# Patient Record
Sex: Female | Born: 1970
Health system: Southern US, Community
[De-identification: ages and names within clinical notes are randomized; demographics above are authoritative.]

## PROBLEM LIST (undated history)

## (undated) ENCOUNTER — Ambulatory Visit: Admission: EM | Disposition: A | Discharge status: Home / Self Care | Payer: Self-pay

## (undated) DIAGNOSIS — J45909 Unspecified asthma, uncomplicated: Secondary | ICD-10-CM

## (undated) HISTORY — PX: HERNIA REPAIR: SHX51

## (undated) HISTORY — PX: TUBAL LIGATION: SHX77

## (undated) HISTORY — PX: OTHER SURGICAL HISTORY: SHX169

## (undated) HISTORY — PX: ABDOMINAL HYSTERECTOMY: SHX81

---

## 2005-12-09 ENCOUNTER — Emergency Department: Payer: Self-pay

## 2006-04-25 ENCOUNTER — Emergency Department: Payer: Self-pay

## 2006-06-24 ENCOUNTER — Ambulatory Visit: Payer: Self-pay

## 2006-08-04 ENCOUNTER — Encounter: Payer: Self-pay | Admitting: Maternal & Fetal Medicine

## 2006-10-18 ENCOUNTER — Observation Stay: Payer: Self-pay

## 2006-11-03 ENCOUNTER — Encounter: Payer: Self-pay | Admitting: Maternal & Fetal Medicine

## 2006-11-13 ENCOUNTER — Observation Stay: Payer: Self-pay

## 2006-12-01 ENCOUNTER — Encounter: Payer: Self-pay | Admitting: Maternal & Fetal Medicine

## 2006-12-05 ENCOUNTER — Observation Stay: Payer: Self-pay

## 2006-12-06 ENCOUNTER — Inpatient Hospital Stay: Payer: Self-pay

## 2007-07-25 ENCOUNTER — Emergency Department: Payer: Self-pay | Admitting: Internal Medicine

## 2008-05-21 ENCOUNTER — Emergency Department: Payer: Self-pay | Admitting: Emergency Medicine

## 2008-09-26 ENCOUNTER — Ambulatory Visit: Payer: Self-pay | Admitting: General Surgery

## 2008-09-30 ENCOUNTER — Ambulatory Visit: Payer: Self-pay | Admitting: General Surgery

## 2008-11-07 ENCOUNTER — Emergency Department: Payer: Self-pay | Admitting: Emergency Medicine

## 2008-11-09 ENCOUNTER — Ambulatory Visit: Payer: Self-pay | Admitting: Emergency Medicine

## 2011-02-03 ENCOUNTER — Ambulatory Visit: Payer: Self-pay | Admitting: Family Medicine

## 2011-05-18 ENCOUNTER — Emergency Department: Payer: Self-pay

## 2011-05-24 ENCOUNTER — Emergency Department: Payer: Self-pay | Admitting: Emergency Medicine

## 2011-08-30 ENCOUNTER — Emergency Department: Payer: Self-pay | Admitting: *Deleted

## 2011-08-30 LAB — BASIC METABOLIC PANEL WITH GFR
Anion Gap: 10
BUN: 14 mg/dL
Calcium, Total: 8.4 mg/dL — ABNORMAL LOW
Chloride: 110 mmol/L — ABNORMAL HIGH
Co2: 22 mmol/L
Creatinine: 1.04 mg/dL
EGFR (African American): >60
EGFR (Non-African Amer.): >60
Glucose: 101 mg/dL — ABNORMAL HIGH
Osmolality: 284
Potassium: 3.6 mmol/L
Sodium: 142 mmol/L

## 2011-08-30 LAB — CBC
HCT: 39.1 % (ref 35.0–47.0)
MCH: 30.9 pg (ref 26.0–34.0)
MCHC: 33.8 g/dL (ref 32.0–36.0)
MCV: 91 fL (ref 80–100)
RBC: 4.28 10*6/uL (ref 3.80–5.20)
RDW: 13.8 % (ref 11.5–14.5)
WBC: 7.5 10*3/uL (ref 3.6–11.0)

## 2011-08-30 LAB — TROPONIN I: Troponin-I: <0.02 ng/mL

## 2011-09-10 ENCOUNTER — Emergency Department: Payer: Self-pay | Admitting: *Deleted

## 2012-05-01 ENCOUNTER — Emergency Department: Payer: Self-pay | Admitting: Emergency Medicine

## 2016-04-20 ENCOUNTER — Emergency Department
Admission: EM | Admit: 2016-04-20 | Discharge: 2016-04-20 | Disposition: A | Discharge status: Home / Self Care | Payer: Medicaid Other | Attending: Emergency Medicine | Admitting: Emergency Medicine

## 2016-04-20 ENCOUNTER — Encounter: Payer: Self-pay | Admitting: Emergency Medicine

## 2016-04-20 ENCOUNTER — Emergency Department: Payer: Medicaid Other

## 2016-04-20 DIAGNOSIS — Y939 Activity, unspecified: Secondary | ICD-10-CM | POA: Insufficient documentation

## 2016-04-20 DIAGNOSIS — Y929 Unspecified place or not applicable: Secondary | ICD-10-CM | POA: Diagnosis not present

## 2016-04-20 DIAGNOSIS — W010XXA Fall on same level from slipping, tripping and stumbling without subsequent striking against object, initial encounter: Secondary | ICD-10-CM | POA: Insufficient documentation

## 2016-04-20 DIAGNOSIS — S6391XA Sprain of unspecified part of right wrist and hand, initial encounter: Secondary | ICD-10-CM | POA: Diagnosis not present

## 2016-04-20 DIAGNOSIS — F17210 Nicotine dependence, cigarettes, uncomplicated: Secondary | ICD-10-CM | POA: Diagnosis not present

## 2016-04-20 DIAGNOSIS — Y999 Unspecified external cause status: Secondary | ICD-10-CM | POA: Insufficient documentation

## 2016-04-20 DIAGNOSIS — S60221A Contusion of right hand, initial encounter: Secondary | ICD-10-CM

## 2016-04-20 DIAGNOSIS — S6991XA Unspecified injury of right wrist, hand and finger(s), initial encounter: Secondary | ICD-10-CM | POA: Diagnosis present

## 2016-04-20 MED ORDER — TRAMADOL HCL 50 MG PO TABS: 50.0000 mg | ORAL_TABLET | Freq: Four times a day (QID) | ORAL | 0 refills | Status: DC | PRN

## 2016-04-20 MED ORDER — IBUPROFEN 600 MG PO TABS
600.0000 mg | ORAL_TABLET | Freq: Three times a day (TID) | ORAL | 0 refills | Status: DC | PRN
Start: 2016-04-20 — End: 2017-05-14

## 2016-04-20 NOTE — Discharge Instructions (Signed)
Wear splint for 3-5 days as needed. °

## 2016-04-20 NOTE — ED Provider Notes (Signed)
[facility] Emergency Department Provider Note   ____________________________________________   First MD Initiated Contact with Patient 04/20/16 1723     (approximate)  I have reviewed the triage vital signs and the nursing notes.   HISTORY  Chief Complaint Hand Pain    HPI Alexis Gilbert is a 45 y.o. female patient complaining of right hand pain secondary to a slip and fall last night. Patient stated pain is increased on the day. Patient stated no relief taking anti-inflammatory medications. Patient rates the pain as a 7/10. She describes pain as "achy". Patient denies loss of sensation or loss of function of the affected extremity. No other palliative measures for her complaint.Patient is right-hand dominant.   History reviewed. No pertinent past medical history.  There are no active problems to display for this patient.   Past Surgical History:  Procedure Laterality Date  . hemmorhoidectomy    . HERNIA REPAIR    . TUBAL LIGATION      Prior to Admission medications   Medication Sig Start Date End Date Taking? Authorizing Provider  ibuprofen (ADVIL,MOTRIN) 600 MG tablet Take 1 tablet (600 mg total) by mouth every 8 (eight) hours as needed. 04/20/16   Joni Reiningonald K Smith, PA-C  traMADol (ULTRAM) 50 MG tablet Take 1 tablet (50 mg total) by mouth every 6 (six) hours as needed for moderate pain. 04/20/16   Joni Reiningonald K Smith, PA-C    Allergies Vicodin [hydrocodone-acetaminophen]  No family history on file.  Social History Social History  Substance Use Topics  . Smoking status: Current Every Day Smoker    Packs/day: 0.30    Types: Cigarettes  . Smokeless tobacco: Not on file  . Alcohol use Not on file    Review of Systems Constitutional: No fever/chills Eyes: No visual changes. ENT: No sore throat. Cardiovascular: Denies chest pain. Respiratory: Denies shortness of breath. Gastrointestinal: No abdominal pain.  No nausea, no vomiting.  No  diarrhea.  No constipation. Genitourinary: Negative for dysuria. Musculoskeletal: Positive for right hand pain  Skin: Negative for rash. Neurological: Negative for headaches, focal weakness or numbness. Allergic/Immunilogical: Vicodin ____________________________________________   PHYSICAL EXAM:  VITAL SIGNS: ED Triage Vitals  Enc Vitals Group     BP 04/20/16 1518 (!) 142/90     Pulse Rate 04/20/16 1518 76     Resp 04/20/16 1518 18     Temp 04/20/16 1518 98.3 F (36.8 C)     Temp Source 04/20/16 1518 Oral     SpO2 04/20/16 1518 98 %     Weight 04/20/16 1519 198 lb (89.8 kg)     Height 04/20/16 1519 5\' 7"  (1.702 m)     Head Circumference --      Peak Flow --      Pain Score 04/20/16 1519 7     Pain Loc --      Pain Edu? --      Excl. in GC? --     Constitutional: Alert and oriented. Well appearing and in no acute distress. Eyes: Conjunctivae are normal. PERRL. EOMI. Head: Atraumatic. Nose: No congestion/rhinnorhea. Mouth/Throat: Mucous membranes are moist.  Oropharynx non-erythematous. Neck: No stridor.  No cervical spine tenderness to palpation. Hematological/Lymphatic/Immunilogical: No cervical lymphadenopathy. Cardiovascular: Normal rate, regular rhythm. Grossly normal heart sounds.  Good peripheral circulation. Respiratory: Normal respiratory effort.  No retractions. Lungs CTAB. Gastrointestinal: Soft and nontender. No distention. No abdominal bruits. No CVA tenderness. Musculoskeletal: No ecchymosis deformity to the right hand. There is some mild edema  at the fifth metacarpal. Patient has full equal range of motion of the hand. Patient has some moderate guarding with flexion extension of the wrist. Neurologic:  Normal speech and language. No gross focal neurologic deficits are appreciated. No gait instability. Skin:  Skin is warm, dry and intact. No rash noted. Psychiatric: Mood and affect are normal. Speech and behavior are  normal.  ____________________________________________   LABS (all labs ordered are listed, but only abnormal results are displayed)  Labs Reviewed - No data to display ____________________________________________  EKG   ____________________________________________  RADIOLOGY  No acute findings x-ray of the right hand. ____________________________________________   PROCEDURES  Procedure(s) performed: None  Procedures  Critical Care performed: No  ____________________________________________   INITIAL IMPRESSION / ASSESSMENT AND PLAN / ED COURSE  Pertinent labs & imaging results that were available during my care of the patient were reviewed by me and considered in my medical decision making (see chart for details).  Sprain and contusion to right hand secondary to a fall. Patient given discharge care instructions. Patient given prescription for tramadol and ibuprofen. Rest and follow-up family doctor condition persists.  Clinical Course    Patient placed in a volar hand splint.   ____________________________________________   FINAL CLINICAL IMPRESSION(S) / ED DIAGNOSES  Final diagnoses:  Contusion of right hand, initial encounter      NEW MEDICATIONS STARTED DURING THIS VISIT:  New Prescriptions   IBUPROFEN (ADVIL,MOTRIN) 600 MG TABLET    Take 1 tablet (600 mg total) by mouth every 8 (eight) hours as needed.   TRAMADOL (ULTRAM) 50 MG TABLET    Take 1 tablet (50 mg total) by mouth every 6 (six) hours as needed for moderate pain.     Note:  This document was prepared using Dragon voice recognition software and may include unintentional dictation errors.    Joni Reiningonald K Smith, PA-C 04/20/16 1730    Jene Everyobert Kinner, MD 04/20/16 [id number]

## 2016-04-20 NOTE — ED Triage Notes (Signed)
Fell last night, R hand pain.

## 2016-04-20 NOTE — ED Notes (Signed)
Pt given discharge instructions. Splint being applied by medic and pt will be d/c'd afterwards.

## 2016-05-15 MED ORDER — METOPROLOL TARTRATE 25 MG PO TABS
ORAL_TABLET | ORAL | Status: AC
  Filled 2016-05-15: qty 1

## 2016-08-09 ENCOUNTER — Emergency Department
Admission: EM | Admit: 2016-08-09 | Discharge: 2016-08-09 | Disposition: A | Discharge status: Home / Self Care | Payer: Medicaid Other | Attending: Emergency Medicine | Admitting: Emergency Medicine

## 2016-08-09 ENCOUNTER — Emergency Department: Payer: Medicaid Other

## 2016-08-09 DIAGNOSIS — M779 Enthesopathy, unspecified: Secondary | ICD-10-CM | POA: Diagnosis not present

## 2016-08-09 DIAGNOSIS — Z79899 Other long term (current) drug therapy: Secondary | ICD-10-CM | POA: Diagnosis not present

## 2016-08-09 DIAGNOSIS — M778 Other enthesopathies, not elsewhere classified: Secondary | ICD-10-CM

## 2016-08-09 DIAGNOSIS — F17210 Nicotine dependence, cigarettes, uncomplicated: Secondary | ICD-10-CM | POA: Insufficient documentation

## 2016-08-09 DIAGNOSIS — M79641 Pain in right hand: Secondary | ICD-10-CM | POA: Diagnosis present

## 2016-08-09 MED ORDER — NAPROXEN 500 MG PO TABS: 500.0000 mg | ORAL_TABLET | Freq: Two times a day (BID) | ORAL | Status: DC

## 2016-08-09 MED ORDER — SULFAMETHOXAZOLE-TRIMETHOPRIM 800-160 MG PO TABS: 1.0000 | ORAL_TABLET | Freq: Two times a day (BID) | ORAL | 0 refills | Status: DC

## 2016-08-09 NOTE — ED Triage Notes (Addendum)
Pt reports to ED w/ c/o R thumb swelling, denies injury to area. Sensation, circulation and motor function intact.  Pts R thumb slightly swollen, pt reports intermittent throbbing pain

## 2016-08-09 NOTE — Discharge Instructions (Signed)
Splint for 3-5 days, take medication as directed. Follow up with orthopedics if no improvement in 3 days.

## 2016-08-09 NOTE — ED Provider Notes (Signed)
[facility] Emergency Department Provider Note   ____________________________________________   First MD Initiated Contact with Patient 08/09/16 1251     (approximate)  I have reviewed the triage vital signs and the nursing notes.   HISTORY  Chief Complaint Hand Pain    HPI Alexis Gilbert is a 46 y.o. female patient complain pain, edema, erythema to the first digit right hand. Patient state onset 3 days. Patient state pain is increasing and she now rates as 8/10. Patient denies any provocative incident for her complaint. Patient denies loss of sensation but states decreased range of motion with adduction movements of the thumb. Patient is right-hand dominant.   History reviewed. No pertinent past medical history.  There are no active problems to display for this patient.   Past Surgical History:  Procedure Laterality Date  . hemmorhoidectomy    . HERNIA REPAIR    . TUBAL LIGATION      Prior to Admission medications   Medication Sig Start Date End Date Taking? Authorizing Provider  traMADol (ULTRAM) 50 MG tablet Take 1 tablet (50 mg total) by mouth every 6 (six) hours as needed for moderate pain. 04/20/16  Yes Joni Reining, PA-C  ibuprofen (ADVIL,MOTRIN) 600 MG tablet Take 1 tablet (600 mg total) by mouth every 8 (eight) hours as needed. 04/20/16   Joni Reining, PA-C  naproxen (NAPROSYN) 500 MG tablet Take 1 tablet (500 mg total) by mouth 2 (two) times daily with a meal. 08/09/16   Joni Reining, PA-C  sulfamethoxazole-trimethoprim (BACTRIM DS,SEPTRA DS) 800-160 MG tablet Take 1 tablet by mouth 2 (two) times daily. 08/09/16   Joni Reining, PA-C    Allergies   No family history on file.  Social History Social History  Substance Use Topics  . Smoking status: Current Every Day Smoker    Packs/day: 0.30    Types: Cigarettes  . Smokeless tobacco: Never Used  . Alcohol use Not on file    Review of Systems Constitutional: No  fever/chills Eyes: No visual changes. ENT: No sore throat. Cardiovascular: Denies chest pain. Respiratory: Denies shortness of breath. Gastrointestinal: No abdominal pain.  No nausea, no vomiting.  No diarrhea.  No constipation. Genitourinary: Negative for dysuria. Musculoskeletal: Right thumb pain  Skin: Negative for rash. Redness and swelling to the first digit right hand Neurological: Negative for headaches, focal weakness or numbness.    ____________________________________________   PHYSICAL EXAM:  VITAL SIGNS: ED Triage Vitals [08/09/16 1251]  Enc Vitals Group     BP 128/84     Pulse Rate 83     Resp 18     Temp 98.1 F (36.7 C)     Temp Source Oral     SpO2 98 %     Weight 185 lb (83.9 kg)     Height 5\' 6"  (1.676 m)     Head Circumference      Peak Flow      Pain Score 8     Pain Loc      Pain Edu?      Excl. in GC?     Constitutional: Alert and oriented. Well appearing and in no acute distress. Eyes: Conjunctivae are normal. PERRL. EOMI. Head: Atraumatic. Nose: No congestion/rhinnorhea. Mouth/Throat: Mucous membranes are moist.  Oropharynx non-erythematous. Neck: No stridor.  No cervical spine tenderness to palpation. Hematological/Lymphatic/Immunilogical: No cervical lymphadenopathy. Cardiovascular: Normal rate, regular rhythm. Grossly normal heart sounds.  Good peripheral circulation. Respiratory: Normal respiratory effort.  No  retractions. Lungs CTAB. Gastrointestinal: Soft and nontender. No distention. No abdominal bruits. No CVA tenderness. Musculoskeletal: No obvious deformity to the first digit right hand. Patient has some mild edema and erythema radiating from the nailbed to the head of the first metacarpal. Neurologic:  Normal speech and language. No gross focal neurologic deficits are appreciated. No gait instability. Skin:  Skin is warm, dry and intact. No rash noted. Psychiatric: Mood and affect are normal. Speech and behavior are  normal.  ____________________________________________   LABS (all labs ordered are listed, but only abnormal results are displayed)  Labs Reviewed - No data to display ____________________________________________  EKG   ____________________________________________  RADIOLOGY  No acute findings on x-ray of the right thumb.____________________________________________   PROCEDURES  Procedure(s) performed: None  Procedures  Critical Care performed: No  ____________________________________________   INITIAL IMPRESSION / ASSESSMENT AND PLAN / ED COURSE  Pertinent labs & imaging results that were available during my care of the patient were reviewed by me and considered in my medical decision making (see chart for details).  Tendinitis right thumb. Patient is discharged care instructions. Discussed x-ray findings with patient. Patient given a prescription for naproxen and Bactrim. Patient advised to wear finger splint for 3 days. Patient advised to follow orthopedics is no improvement in 3 days.      ____________________________________________   FINAL CLINICAL IMPRESSION(S) / ED DIAGNOSES  Final diagnoses:  Tendinitis of thumb      NEW MEDICATIONS STARTED DURING THIS VISIT:  Discharge Medication List as of 08/09/2016  1:39 PM    START taking these medications   Details  naproxen (NAPROSYN) 500 MG tablet Take 1 tablet (500 mg total) by mouth 2 (two) times daily with a meal., Starting Fri 08/09/2016, Print    sulfamethoxazole-trimethoprim (BACTRIM DS,SEPTRA DS) 800-160 MG tablet Take 1 tablet by mouth 2 (two) times daily., Starting Fri 08/09/2016, Print         Note:  This document was prepared using Dragon voice recognition software and may include unintentional dictation errors.    Joni Reiningonald K Legrande Hao, PA-C 08/09/16 1345    Governor Rooksebecca Lord, MD 08/09/16 [phone]

## 2017-03-26 ENCOUNTER — Ambulatory Visit: Payer: Self-pay | Admitting: Physician Assistant

## 2017-03-31 ENCOUNTER — Ambulatory Visit: Payer: Self-pay | Admitting: Physician Assistant

## 2017-03-31 ENCOUNTER — Encounter: Payer: Self-pay | Admitting: Physician Assistant

## 2017-03-31 VITALS — BP 128/80 | HR 95 | Temp 97.8°F

## 2017-03-31 DIAGNOSIS — H6502 Acute serous otitis media, left ear: Secondary | ICD-10-CM

## 2017-03-31 MED ORDER — AMOXICILLIN 875 MG PO TABS: 875.0000 mg | ORAL_TABLET | Freq: Two times a day (BID) | ORAL | 0 refills | Status: DC

## 2017-03-31 MED ORDER — PREDNISONE 10 MG PO TABS: 30.0000 mg | ORAL_TABLET | Freq: Every day | ORAL | 0 refills | Status: DC

## 2017-03-31 NOTE — Progress Notes (Signed)
S: c/o left ear pain, thought it was swimmers ear and used drops, can't hear well out of the ear, no fever/chills, some sinus congestion, no cough, cp/sob  O: vitals wnl, nad, left tm dull + fluid line, r tm is dull, nasal mucosa inflamed, throat wnl, neck supple no lymph, lungs c t a, cv rrr  A: acute otitis media  P: amoxil, pred 30mg  qd x 3d, otc sudafed, if hearing is not better in 5 days will refer to ENT

## 2017-05-14 ENCOUNTER — Encounter: Payer: Self-pay | Admitting: Emergency Medicine

## 2017-05-14 ENCOUNTER — Emergency Department
Admission: EM | Admit: 2017-05-14 | Discharge: 2017-05-14 | Disposition: A | Discharge status: Home / Self Care | Payer: Self-pay | Attending: Emergency Medicine | Admitting: Emergency Medicine

## 2017-05-14 DIAGNOSIS — Z79899 Other long term (current) drug therapy: Secondary | ICD-10-CM | POA: Insufficient documentation

## 2017-05-14 DIAGNOSIS — R69 Illness, unspecified: Secondary | ICD-10-CM | POA: Insufficient documentation

## 2017-05-14 DIAGNOSIS — F17210 Nicotine dependence, cigarettes, uncomplicated: Secondary | ICD-10-CM | POA: Insufficient documentation

## 2017-05-14 DIAGNOSIS — J111 Influenza due to unidentified influenza virus with other respiratory manifestations: Secondary | ICD-10-CM

## 2017-05-14 LAB — INFLUENZA PANEL BY PCR (TYPE A & B)
INFLAPCR: NEGATIVE
INFLBPCR: NEGATIVE

## 2017-05-14 MED ORDER — IBUPROFEN 600 MG PO TABS
600.0000 mg | ORAL_TABLET | Freq: Once | ORAL | Status: AC
  Administered 2017-05-14: 600 mg via ORAL
  Filled 2017-05-14: qty 1

## 2017-05-14 NOTE — ED Triage Notes (Signed)
Patient presents to ED via POV from work with c/o flu like symptoms x 2 days. Patient texting during triage.

## 2017-05-14 NOTE — ED Provider Notes (Signed)
[facility] Emergency Department Provider Note  ____________________________________________   First MD Initiated Contact with Patient 05/14/17 1220     (approximate)  I have reviewed the triage vital signs and the nursing notes.   HISTORY  Chief Complaint Generalized Body Aches    HPI Alexis Gilbert is a 46 y.o. female complains of body aches and questionable fever, states her ears hurt, states she just hurts all over, some cough, no sore throat, no vomiting or diarrhea, she did get her flu vaccine  History reviewed. No pertinent past medical history.  There are no active problems to display for this patient.   Past Surgical History:  Procedure Laterality Date  . hemmorhoidectomy    . HERNIA REPAIR    . TUBAL LIGATION      Prior to Admission medications   Medication Sig Start Date End Date Taking? Authorizing Provider  levonorgestrel (MIRENA) 20 MCG/24HR IUD 1 each once by Intrauterine route.    [provider]    Allergies Codeine and Vicodin [hydrocodone-acetaminophen]  No family history on file.  Social History Social History   Tobacco Use  . Smoking status: Current Every Day Smoker    Packs/day: 0.30    Types: Cigarettes  . Smokeless tobacco: Never Used  Substance Use Topics  . Alcohol use: Not on file  . Drug use: Not on file    Review of Systems  Constitutional: Positive fever/chills, and body aches Eyes: No visual changes. ENT: No sore throat. Respiratory: Positive cough Genitourinary: Negative for dysuria. Musculoskeletal: Negative for back pain. Skin: Negative for rash.    ____________________________________________   PHYSICAL EXAM:  VITAL SIGNS: ED Triage Vitals  Enc Vitals Group     BP 05/14/17 1154 118/84     Pulse Rate 05/14/17 1154 71     Resp 05/14/17 1154 18     Temp 05/14/17 1154 98.1 F (36.7 C)     Temp Source 05/14/17 1154 Oral     SpO2 05/14/17 1154 97 %     Weight 05/14/17  1154 189 lb (85.7 kg)     Height 05/14/17 1154 5\' 7"  (1.702 m)     Head Circumference --      Peak Flow --      Pain Score 05/14/17 1242 5     Pain Loc --      Pain Edu? --      Excl. in GC? --     Constitutional: Alert and oriented. Well appearing and in no acute distress. Eyes: Conjunctivae are normal.  Head: Atraumatic. Nose: No congestion/rhinnorhea. Mouth/Throat: Mucous membranes are moist.  Throat is normal Cardiovascular: Normal rate, regular rhythm.  Heart sounds are normal Respiratory: Normal respiratory effort.  No retractions, lungs are clear to auscultation GU: deferred Musculoskeletal: FROM all extremities, warm and well perfused Neurologic:  Normal speech and language.  Skin:  Skin is warm, dry and intact. No rash noted. Psychiatric: Mood and affect are normal. Speech and behavior are normal.  ____________________________________________   LABS (all labs ordered are listed, but only abnormal results are displayed)  Labs Reviewed  INFLUENZA PANEL BY PCR (TYPE A & B)   ____________________________________________   ____________________________________________  RADIOLOGY    ____________________________________________   PROCEDURES  Procedure(s) performed: No      ____________________________________________   INITIAL IMPRESSION / ASSESSMENT AND PLAN / ED COURSE  Pertinent labs & imaging results that were available during my care of the patient were reviewed by me and considered in my  medical decision making (see chart for details).  Patient is a [age] year old female complains of fever, chills and body aches, also states that her right ear hurts patient is afraid that she has the flu, she is involved in patient care, flu swab was obtained    ----------------------------------------- 2:16 PM on 05/14/2017 -----------------------------------------  Flu swab is negative, explained to patient that it is viral, she is to follow-up with her regular  doctor if she is not better in 3-5 days, she may return to work tomorrow if she is not had a fever, hospital policy as she has to be fever free for 24 hours, she is afebrile here in the ER, patient states she understands the diagnosis and treatment plan and will comply, she was discharged in stable condition   ____________________________________________   FINAL CLINICAL IMPRESSION(S) / ED DIAGNOSES  Final diagnoses:  Influenza-like illness      NEW MEDICATIONS STARTED DURING THIS VISIT:  This SmartLink is deprecated. Use AVSMEDLIST instead to display the medication list for a patient.   Note:  This document was prepared using Dragon voice recognition software and may include unintentional dictation errors.    Faythe GheeFisher, Susan W, PA-C 05/14/17 1417    Emily FilbertWilliams, Jonathan E, MD 05/14/17 [phone]

## 2017-05-14 NOTE — Discharge Instructions (Signed)
Follow-up with your regular doctor if you are not better in 3-5 days, take Tylenol and ibuprofen as needed for fever and body aches, drink plenty of fluids, if you are worsening can return to the emergency department

## 2017-07-15 ENCOUNTER — Ambulatory Visit: Payer: Self-pay | Admitting: Nurse Practitioner

## 2017-07-15 VITALS — BP 125/79 | HR 85 | Temp 97.7°F | Wt 192.0 lb

## 2017-07-15 DIAGNOSIS — R6889 Other general symptoms and signs: Secondary | ICD-10-CM

## 2017-07-15 DIAGNOSIS — R52 Pain, unspecified: Secondary | ICD-10-CM

## 2017-07-15 LAB — POCT INFLUENZA A/B
INFLUENZA B, POC: NEGATIVE
Influenza A, POC: NEGATIVE

## 2017-07-15 MED ORDER — OSELTAMIVIR PHOSPHATE 75 MG PO CAPS: 75.0000 mg | ORAL_CAPSULE | Freq: Two times a day (BID) | ORAL | 0 refills | Status: AC

## 2017-07-15 NOTE — Patient Instructions (Addendum)

## 2017-07-15 NOTE — Progress Notes (Signed)
Subjective:  Alexis Gilbert is a 47 y.o. female who presents for evaluation of influenza like symptoms.  Symptoms include bilateral ear pressure/pain, achiness, congestion, nasal congestion, post nasal drip, productive cough with clear colored sputum and headache.  Onset of symptoms was 1 day ago, and has been unchanged since that time.  Treatment to date:  acetaminophen.  High risk factors for influenza complications:  patient was exposed by several co-workers that were diagnosed and had contact with a family member that was diagnosed.  The following portions of the patient's history were reviewed and updated as appropriate:  allergies, current medications and past medical history.  Constitutional: positive for anorexia, chills and fatigue, negative for fevers, malaise, night sweats and sweats Eyes: negative Ears, nose, mouth, throat, and face: positive for nasal congestion and bilateral ear pressure, negative for ear drainage, earaches and sore throat Respiratory: positive for cough and sputum, negative for asthma, dyspnea on exertion, pneumonia, stridor and wheezing Cardiovascular: negative Gastrointestinal: positive for decreased appetite, negative for abdominal pain, change in bowel habits, constipation, diarrhea, nausea and vomiting Neurological: positive for headaches, negative for coordination problems, dizziness, paresthesia, tremors, vertigo and weakness Objective:  BP 125/79   Pulse 85   Temp 97.7 F (36.5 C)   Wt 192 lb (87.1 kg)   SpO2 97%   BMI 30.07 kg/m  General appearance: alert, cooperative and fatigued Head: Normocephalic, without obvious abnormality, atraumatic Eyes: conjunctivae/corneas clear. PERRL, EOM's intact. Fundi benign. Ears: normal TM's and external ear canals both ears Nose: clear discharge, moderate congestion, turbinates swollen, inflamed, mild maxillary sinus tenderness bilateral, mild frontal sinus tenderness bilateral Throat: lips, mucosa, and tongue  normal; teeth and gums normal Lungs: clear to auscultation bilaterally Heart: regular rate and rhythm, S1, S2 normal, no murmur, click, rub or gallop Abdomen: soft, non-tender; bowel sounds normal; no masses,  no organomegaly Pulses: 2+ and symmetric Skin: Skin color, texture, turgor normal. No rashes or lesions Lymph nodes: cervical and submandibular nodes normal Neurologic: Grossly normal    Assessment:  influenza like syndrome    Plan:  Antivirals per ordrers. Discussed diagnosis and treatment of influenza. Discussed the importance of avoiding unnecessary antibiotic therapy. Educational material distributed and questions answered. Suggested symptomatic OTC remedies. Supportive care with appropriate antipyretics and fluids. Nasal saline spray for congestion. Follow up as needed. Work note provided.

## 2017-07-16 ENCOUNTER — Encounter: Payer: Self-pay | Admitting: Nurse Practitioner

## 2017-07-17 ENCOUNTER — Emergency Department
Admission: EM | Admit: 2017-07-17 | Discharge: 2017-07-17 | Disposition: A | Discharge status: Home / Self Care | Payer: Self-pay | Attending: Emergency Medicine | Admitting: Emergency Medicine

## 2017-07-17 ENCOUNTER — Emergency Department: Payer: Self-pay

## 2017-07-17 ENCOUNTER — Encounter: Payer: Self-pay | Admitting: Emergency Medicine

## 2017-07-17 ENCOUNTER — Other Ambulatory Visit: Payer: Self-pay

## 2017-07-17 DIAGNOSIS — J069 Acute upper respiratory infection, unspecified: Secondary | ICD-10-CM | POA: Insufficient documentation

## 2017-07-17 DIAGNOSIS — F17210 Nicotine dependence, cigarettes, uncomplicated: Secondary | ICD-10-CM | POA: Insufficient documentation

## 2017-07-17 DIAGNOSIS — B9789 Other viral agents as the cause of diseases classified elsewhere: Secondary | ICD-10-CM | POA: Insufficient documentation

## 2017-07-17 DIAGNOSIS — Z79899 Other long term (current) drug therapy: Secondary | ICD-10-CM | POA: Insufficient documentation

## 2017-07-17 LAB — CBC
HEMATOCRIT: 45.8 % (ref 35.0–47.0)
Hemoglobin: 15.2 g/dL (ref 12.0–16.0)
MCH: 29.8 pg (ref 26.0–34.0)
MCHC: 33.1 g/dL (ref 32.0–36.0)
MCV: 90 fL (ref 80.0–100.0)
Platelets: 187 10*3/uL (ref 150–440)
RBC: 5.09 MIL/uL (ref 3.80–5.20)
RDW: 13.5 % (ref 11.5–14.5)
WBC: 5.7 10*3/uL (ref 3.6–11.0)

## 2017-07-17 LAB — URINALYSIS, COMPLETE (UACMP) WITH MICROSCOPIC
Bacteria, UA: NONE SEEN
Bilirubin Urine: NEGATIVE
Glucose, UA: NEGATIVE mg/dL
Ketones, ur: NEGATIVE mg/dL
Leukocytes, UA: NEGATIVE
Nitrite: NEGATIVE
Protein, ur: NEGATIVE mg/dL
Specific Gravity, Urine: 1.02 (ref 1.005–1.030)
pH: 5 (ref 5.0–8.0)

## 2017-07-17 LAB — BASIC METABOLIC PANEL
Anion gap: 8 (ref 5–15)
BUN: 17 mg/dL (ref 6–20)
CO2: 20 mmol/L — ABNORMAL LOW (ref 22–32)
Calcium: 9.2 mg/dL (ref 8.9–10.3)
Chloride: 109 mmol/L (ref 101–111)
Creatinine, Ser: 0.83 mg/dL (ref 0.44–1.00)
GFR calc Af Amer: >60 mL/min (ref >60)
GFR calc non Af Amer: >60 mL/min (ref >60)
Glucose, Bld: 105 mg/dL — ABNORMAL HIGH (ref 65–99)
Potassium: 3.8 mmol/L (ref 3.5–5.1)
Sodium: 137 mmol/L (ref 135–145)

## 2017-07-17 LAB — POCT PREGNANCY, URINE: PREG TEST UR: NEGATIVE

## 2017-07-17 MED ORDER — ALBUTEROL SULFATE HFA 108 (90 BASE) MCG/ACT IN AERS: 2.0000 | INHALATION_SPRAY | Freq: Four times a day (QID) | RESPIRATORY_TRACT | 2 refills | Status: DC | PRN

## 2017-07-17 MED ORDER — ALBUTEROL SULFATE (2.5 MG/3ML) 0.083% IN NEBU
5.0000 mg | INHALATION_SOLUTION | Freq: Once | RESPIRATORY_TRACT | Status: AC
  Administered 2017-07-17: 5 mg via RESPIRATORY_TRACT
  Filled 2017-07-17: qty 6

## 2017-07-17 MED ORDER — ONDANSETRON HCL 4 MG/2ML IJ SOLN: 4.0000 mg | Freq: Once | INTRAMUSCULAR | Status: DC

## 2017-07-17 MED ORDER — KETOROLAC TROMETHAMINE 30 MG/ML IJ SOLN
15.0000 mg | Freq: Once | INTRAMUSCULAR | Status: AC
  Administered 2017-07-17: 15 mg via INTRAVENOUS
  Filled 2017-07-17: qty 1

## 2017-07-17 MED ORDER — OXYMETAZOLINE HCL 0.05 % NA SOLN
1.0000 | Freq: Once | NASAL | Status: AC
  Administered 2017-07-17: 1 via NASAL
  Filled 2017-07-17: qty 15

## 2017-07-17 MED ORDER — ONDANSETRON HCL 4 MG/2ML IJ SOLN
4.0000 mg | Freq: Once | INTRAMUSCULAR | Status: AC | PRN
  Administered 2017-07-17: 4 mg via INTRAVENOUS
  Filled 2017-07-17: qty 2

## 2017-07-17 MED ORDER — SODIUM CHLORIDE 0.9 % IV BOLUS (SEPSIS)
1000.0000 mL | Freq: Once | INTRAVENOUS | Status: AC
  Administered 2017-07-17: 1000 mL via INTRAVENOUS

## 2017-07-17 NOTE — ED Triage Notes (Signed)
Pt presents to ED via POV c/o dizziness and nausea. States was seen by PCP Monday and diagnosed with flu. Has been taking tamiflu and OTC meds but does not feel any better.

## 2017-07-17 NOTE — ED Provider Notes (Signed)
[facility] Emergency Department Provider Note  ____________________________________________  Time seen: Approximately 4:30 PM  I have reviewed the triage vital signs and the nursing notes.   HISTORY  Chief Complaint Dizziness and Nausea   HPI Alexis Gilbert is a 47 y.o. female with no significant past medical history who presents for evaluation of dizziness and nausea. The patient reports 3 days of malaise, body aches, congestion, sinus congestion, nausea, and productive cough. She saw her primary care doctor yesterday and had negative flu swab but was still started on Tamiflu. She reports that today she feels worse. She is unable to breathe due to worsening sinus congestion. She had a fever of 101F at home, her cough is more pronounced. 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 No diarrhea or vomiting. She is complaining of shortness of breath which is mild and mostly on exertion which resolves at rest. No chest pain.She is a smoker.   PMH Depression anxiety  Past Surgical History:  Procedure Laterality Date  . hemmorhoidectomy    . HERNIA REPAIR    . TUBAL LIGATION      Prior to Admission medications   Medication Sig Start Date End Date Taking? Authorizing Provider  levonorgestrel (MIRENA) 20 MCG/24HR IUD 1 each once by Intrauterine route.   Yes [provider]  oseltamivir (TAMIFLU) 75 MG capsule Take 1 capsule (75 mg total) by mouth 2 (two) times daily for 5 days. Patient not taking: Reported on 07/17/2017 07/15/17 07/20/17  Benay PikeLeath, Christie Janell, NP    Allergies Codeine and Vicodin [hydrocodone-acetaminophen]  FH Alcohol abuse Father    COPD Father    Cancer Father    Alcohol abuse Maternal Grandfather    Cancer Maternal Grandmother    Heart failure Maternal Uncle    Asthma Mother    COPD Mother    Asthma Sister      Social History Social History   Tobacco Use  . Smoking status:  Current Every Day Smoker    Packs/day: 0.30    Types: Cigarettes  . Smokeless tobacco: Never Used  Substance Use Topics  . Alcohol use: No    Frequency: Never  . Drug use: Not on file    Review of Systems  Constitutional: + fever, body aches Eyes: Negative for visual changes. ENT: Negative for sore throat. + congestion Neck: No neck pain  Cardiovascular: Negative for chest pain. Respiratory: + shortness of breath and cough Gastrointestinal: Negative for abdominal pain, vomiting or diarrhea. + nausea Genitourinary: Negative for dysuria. Musculoskeletal: Negative for back pain. Skin: Negative for rash. Neurological: Negative for headaches, weakness or numbness. Psych: No SI or HI  ____________________________________________   PHYSICAL EXAM:  VITAL SIGNS: ED Triage Vitals [07/17/17 1547]  Enc Vitals Group     BP (!) 125/103     Pulse Rate 83     Resp 18     Temp 97.7 F (36.5 C)     Temp Source Oral     SpO2 97 %     Weight 193 lb (87.5 kg)     Height 5\' 7"  (1.702 m)     Head Circumference      Peak Flow      Pain Score 10     Pain Loc      Pain Edu?      Excl. in GC?     Constitutional: Alert and oriented. Well appearing and in no apparent distress. HEENT:  Head: Normocephalic and atraumatic.         Eyes: Conjunctivae are normal. Sclera is non-icteric.       Mouth/Throat: Mucous membranes are moist.       Neck: Supple with no signs of meningismus. Cardiovascular: Regular rate and rhythm. No murmurs, gallops, or rubs. 2+ symmetrical distal pulses are present in all extremities. No JVD. Respiratory: Normal respiratory effort. Diminished air movement bilaterally with no wheezes, crackles, or rhonchi.  Gastrointestinal: Soft, non tender, and non distended with positive bowel sounds. No rebound or guarding. Genitourinary: No CVA tenderness. Musculoskeletal: Nontender with normal range of motion in all extremities. No edema, cyanosis, or erythema of  extremities. Neurologic: Normal speech and language. Face is symmetric. Moving all extremities. No gross focal neurologic deficits are appreciated. Skin: Skin is warm, dry and intact. No rash noted. Psychiatric: Mood and affect are normal. Speech and behavior are normal.  ____________________________________________   LABS (all labs ordered are listed, but only abnormal results are displayed)  Labs Reviewed  BASIC METABOLIC PANEL - Abnormal; Notable for the following components:      Result Value   CO2 20 (*)    Glucose, Bld 105 (*)    All other components within normal limits  URINALYSIS, COMPLETE (UACMP) WITH MICROSCOPIC - Abnormal; Notable for the following components:   Color, Urine YELLOW (*)    APPearance CLEAR (*)    Hgb urine dipstick MODERATE (*)    Squamous Epithelial / LPF 0-5 (*)    All other components within normal limits  CBC  POC URINE PREG, ED  POCT PREGNANCY, URINE   ____________________________________________  EKG  ED ECG REPORT I, Nita Sickle, the attending physician, personally viewed and interpreted this ECG.  Normal sinus rhythm, rate of 90, normal intervals, normal axis, no ST elevations or depressions. Normal EKG. ____________________________________________  RADIOLOGY  I have personally reviewed the images performed during this visit and I agree with the Radiologist's read.   Interpretation by Radiologist:  Dg Chest 2 View  Result Date: 07/17/2017 CLINICAL DATA:  Dizziness, nausea, sinus congestion EXAM: CHEST  2 VIEW COMPARISON:  08/30/2011 FINDINGS: The heart size and mediastinal contours are within normal limits. Both lungs are clear. The visualized skeletal structures are unremarkable. IMPRESSION: No active cardiopulmonary disease. Electronically Signed   By: Elige Ko   On: 07/17/2017 17:03      ____________________________________________   PROCEDURES  Procedure(s) performed: None Procedures Critical Care performed:   None ____________________________________________   INITIAL IMPRESSION / ASSESSMENT AND PLAN / ED COURSE   47 y.o. female with no significant past medical history who presents for evaluation of dizziness, nausea, productive cough, shortness of breath, sinus congestion, fever 3 days. Flu swab negative yesterday. Patient is well appearing, looks well-hydrated, has normal vital signs, normal work of breathing with decreased air movement bilaterally. Patient has a history of smoking. We'll give her albuterol. We'll give Afrin for sinus congestion. We'll do chest x-ray to rule out pneumonia. Labs show a normal white count, normal electrolytes, no signs of dehydration. Will give Toradol for body aches, IV fluids and Zofran for nausea.    _________________________ 7:38 PM on 07/17/2017 -----------------------------------------  Chest x-ray with no evidence of pneumonia. Labs within normal limits. UA with no evidence of UTI or acute tones. Patient feels markedly improved after Toradol, fluids and Zofran. We'll discharge home with supportive care and follow-up with primary care doctor.   As part of my medical decision making, I reviewed the following data within  the electronic MEDICAL RECORD NUMBER Nursing notes reviewed and incorporated, Labs reviewed , EKG interpreted , Radiograph reviewed , Notes from prior ED visits and [place] Controlled Substance Database    Pertinent labs & imaging results that were available during my care of the patient were reviewed by me and considered in my medical decision making (see chart for details).    ____________________________________________   FINAL CLINICAL IMPRESSION(S) / ED DIAGNOSES  Final diagnoses:  Viral URI with cough      NEW MEDICATIONS STARTED DURING THIS VISIT:  ED Discharge Orders    None       Note:  This document was prepared using Dragon voice recognition software and may include unintentional dictation errors.    Don Perking, [place],  MD 07/17/17 [phone]

## 2017-07-17 NOTE — ED Notes (Signed)
Pt attempted urine sample but unable to go

## 2017-07-20 ENCOUNTER — Encounter: Payer: Self-pay | Admitting: Nurse Practitioner

## 2017-12-02 ENCOUNTER — Emergency Department
Admission: EM | Admit: 2017-12-02 | Discharge: 2017-12-02 | Disposition: A | Discharge status: Home / Self Care | Payer: Self-pay | Attending: Emergency Medicine | Admitting: Emergency Medicine

## 2017-12-02 ENCOUNTER — Encounter: Payer: Self-pay | Admitting: Emergency Medicine

## 2017-12-02 ENCOUNTER — Emergency Department: Payer: Self-pay

## 2017-12-02 ENCOUNTER — Other Ambulatory Visit: Payer: Self-pay

## 2017-12-02 DIAGNOSIS — R0789 Other chest pain: Secondary | ICD-10-CM | POA: Insufficient documentation

## 2017-12-02 DIAGNOSIS — F17210 Nicotine dependence, cigarettes, uncomplicated: Secondary | ICD-10-CM | POA: Insufficient documentation

## 2017-12-02 DIAGNOSIS — Z79899 Other long term (current) drug therapy: Secondary | ICD-10-CM | POA: Insufficient documentation

## 2017-12-02 LAB — CBC
HCT: 41.2 % (ref 35.0–47.0)
Hemoglobin: 14 g/dL (ref 12.0–16.0)
MCH: 30.5 pg (ref 26.0–34.0)
MCHC: 34 g/dL (ref 32.0–36.0)
MCV: 89.8 fL (ref 80.0–100.0)
PLATELETS: 173 10*3/uL (ref 150–440)
RBC: 4.59 MIL/uL (ref 3.80–5.20)
RDW: 13 % (ref 11.5–14.5)
WBC: 5.6 10*3/uL (ref 3.6–11.0)

## 2017-12-02 LAB — BASIC METABOLIC PANEL
Anion gap: 9 (ref 5–15)
BUN: 16 mg/dL (ref 6–20)
CHLORIDE: 106 mmol/L (ref 98–111)
CO2: 25 mmol/L (ref 22–32)
CREATININE: 0.95 mg/dL (ref 0.44–1.00)
Calcium: 9.3 mg/dL (ref 8.9–10.3)
GFR calc non Af Amer: >60 mL/min (ref >60)
Glucose, Bld: 118 mg/dL — ABNORMAL HIGH (ref 70–99)
Potassium: 3.7 mmol/L (ref 3.5–5.1)
Sodium: 140 mmol/L (ref 135–145)

## 2017-12-02 LAB — POCT PREGNANCY, URINE: PREG TEST UR: NEGATIVE

## 2017-12-02 LAB — TROPONIN I: Troponin I: <0.03 ng/mL (ref <0.03)

## 2017-12-02 LAB — FIBRIN DERIVATIVES D-DIMER (ARMC ONLY): Fibrin derivatives D-dimer (ARMC): 166.98 ng/mL (FEU) (ref 0.00–499.00)

## 2017-12-02 MED ORDER — KETOROLAC TROMETHAMINE 30 MG/ML IJ SOLN
15.0000 mg | Freq: Once | INTRAMUSCULAR | Status: AC
  Administered 2017-12-02: 15 mg via INTRAVENOUS
  Filled 2017-12-02: qty 1

## 2017-12-02 NOTE — ED Triage Notes (Signed)
Says sudden onset left side chest pain just now.  Says felt lightheaded.

## 2017-12-02 NOTE — ED Provider Notes (Addendum)
[facility] Emergency Department Provider Note   ____________________________________________   First MD Initiated Contact with Patient 12/02/17 1134     (approximate)  I have reviewed the triage vital signs and the nursing notes.   HISTORY  Chief Complaint Chest Pain    HPI Alexis Gilbert is a 47 y.o. female who reports sudden onset of chest pain which is sharp and stabbing.  Is worse with palpation.  She reports she has had it before but never quite this bad.  She got a little bit lightheaded with it.  Patient smokes   History reviewed. No pertinent past medical history.  There are no active problems to display for this patient.   Past Surgical History:  Procedure Laterality Date  . hemmorhoidectomy    . HERNIA REPAIR    . TUBAL LIGATION      Prior to Admission medications   Medication Sig Start Date End Date Taking? Authorizing Provider  escitalopram (LEXAPRO) 20 MG tablet Take 1 tablet by mouth daily. 12/29/15  Yes [provider]  levonorgestrel (MIRENA) 20 MCG/24HR IUD 1 each once by Intrauterine route.   Yes [provider]  albuterol (PROVENTIL HFA;VENTOLIN HFA) 108 (90 Base) MCG/ACT inhaler Inhale 2 puffs into the lungs every 6 (six) hours as needed for wheezing or shortness of breath. 07/17/17   Nita SickleVeronese, [place], MD    Allergies Codeine and Vicodin [hydrocodone-acetaminophen]  History reviewed. No pertinent family history.  Social History Social History   Tobacco Use  . Smoking status: Current Every Day Smoker    Packs/day: 0.30    Types: Cigarettes  . Smokeless tobacco: Never Used  Substance Use Topics  . Alcohol use: No    Frequency: Never  . Drug use: Not on file    Review of Systems  Constitutional: No fever/chills Eyes: No visual changes. ENT: No sore throat. Cardiovascular: chest pain. Respiratory: Denies shortness of breath. Gastrointestinal: No abdominal pain.  No nausea, no vomiting.  No  diarrhea.  No constipation. Genitourinary: Negative for dysuria. Musculoskeletal: Negative for back pain. Skin: Negative for rash. Neurological: Negative for headaches, focal weakness or numbness.   ____________________________________________   PHYSICAL EXAM:  VITAL SIGNS: ED Triage Vitals  Enc Vitals Group     BP 12/02/17 1028 115/76     Pulse Rate 12/02/17 1028 70     Resp 12/02/17 1028 20     Temp 12/02/17 1028 98.3 F (36.8 C)     Temp Source 12/02/17 1028 Oral     SpO2 12/02/17 1028 99 %     Weight 12/02/17 1028 193 lb (87.5 kg)     Height 12/02/17 1028 5\' 7"  (1.702 m)     Head Circumference --      Peak Flow --      Pain Score 12/02/17 1042 6     Pain Loc --      Pain Edu? --    Constitutional: Alert and oriented. Well appearing and in no acute distress. Eyes: Conjunctivae are normal.  Head: Atraumatic. Nose: No congestion/rhinnorhea. Mouth/Throat: Mucous membranes are moist.  Oropharynx non-erythematous. Neck: No stridor.   Cardiovascular: Normal rate, regular rhythm. Grossly normal heart sounds.  Good peripheral circulation. Respiratory: Normal respiratory effort.  No retractions. Lungs CTAB. Gastrointestinal: Soft and nontender. No distention. No abdominal bruits. No CVA tenderness. Musculoskeletal: No lower extremity tenderness nor edema.   Neurologic:  Normal speech and language. No gross focal neurologic deficits are appreciated.  Skin:  Skin is warm, dry  and intact. No rash noted. Psychiatric: Mood and affect are normal. Speech and behavior are normal.  ____________________________________________   LABS (all labs ordered are listed, but only abnormal results are displayed)  Labs Reviewed  BASIC METABOLIC PANEL - Abnormal; Notable for the following components:      Result Value   Glucose, Bld 118 (*)    All other components within normal limits  CBC  TROPONIN I  FIBRIN DERIVATIVES D-DIMER ([facility] ONLY)  POCT PREGNANCY, URINE  POC URINE PREG, ED    ____________________________________________  EKG  EKG read interpreted by me shows normal sinus rhythm rate of 74 normal axis no acute ST-T wave changes ____________________________________________  RADIOLOGY  ED MD interpretation: X-ray read by radiology reviewed by me shows no acute changes  Official radiology report(s): Dg Chest 2 View  Result Date: 12/02/2017 CLINICAL DATA:  47 year old female with chest pain onset this morning. Smoker. EXAM: CHEST - 2 VIEW COMPARISON:  Chest radiographs 07/17/2017, 08/30/2011. FINDINGS: Stable lung volumes at the upper limits of normal to mildly enlarged. Mediastinal contours remain normal. Mild increased pulmonary interstitial markings are stable, likely related to smoking. No pneumothorax, pulmonary edema, pleural effusion or confluent pulmonary opacity. No acute osseous abnormality identified. Negative visible bowel gas pattern. IMPRESSION: 1.  No acute cardiopulmonary abnormality. 2. Borderline to mild pulmonary hyperinflation. Electronically Signed   By: Odessa Fleming M.D.   On: 12/02/2017 11:08    ____________________________________________   PROCEDURES  Procedure(s) performed:  Procedures  Critical Care performed:   ____________________________________________   INITIAL IMPRESSION / ASSESSMENT AND PLAN / ED COURSE  She is doing well chest x-ray lab work EKG all look okay patient is tender over the chest wall.  This appears to be most consistent with costochondritis.  Will discharge patient with follow-up. Patient reports on discharge she has had this for quite a while as of noted before but this time she says sometimes she has to lay on the couch and put her head on the floor arms over her head almost stand on her head to get the pain to get better.  Not sure what this could be.  It does not sound like reflux.  It certainly does not sound like a PE and the d-dimer is negative.  I will have her follow-up with cardiology as she says when the  pain gets very bad she feels like she may pass out.     ____________________________________________   FINAL CLINICAL IMPRESSION(S) / ED DIAGNOSES  Final diagnoses:  Chest wall pain     ED Discharge Orders    None       Note:  This document was prepared using Dragon voice recognition software and may include unintentional dictation errors.    Arnaldo Natal, MD 12/02/17 1249    Arnaldo Natal, MD 12/02/17 [phone]

## 2017-12-02 NOTE — Discharge Instructions (Addendum)
[facility], Dr. Okey DupreEnd.  Give them a call when you get home and set up an appointment next few days.  I am not sure that it is your heart but since you feel like you are going to pass out when the pain gets bad I think it would be best to follow-up with cardiology.  try Tylenol or Motrin for pain in the meantime. The heart blood work and x-rays look good.  With the chest being tender it is likely to be some inflammation in the joints between the breastbone and the ribs.  Tylenol or Motrin should help with that.  Please return for worse pain fever shortness of breath or feeling sicker.  Please follow-up with your regular doctor in the next week or so as well.

## 2017-12-02 NOTE — ED Notes (Signed)
NAD noted at time of D/C. Pt denies questions or concerns. Pt ambulatory to the lobby at this time.  

## 2017-12-02 NOTE — ED Notes (Addendum)
This RN was escorting patient out when pt states she became dizzy and felt "funny". This RN offered to have MD come reassess patient, pt refused and continued walking stating "I got this!".

## 2018-01-27 ENCOUNTER — Ambulatory Visit: Payer: Self-pay | Admitting: Cardiovascular Disease

## 2018-01-28 ENCOUNTER — Encounter: Payer: Self-pay | Admitting: Cardiovascular Disease

## 2018-03-07 ENCOUNTER — Encounter: Payer: Self-pay | Admitting: Emergency Medicine

## 2018-03-07 ENCOUNTER — Other Ambulatory Visit: Payer: Self-pay

## 2018-03-07 ENCOUNTER — Emergency Department: Payer: Self-pay

## 2018-03-07 ENCOUNTER — Observation Stay
Admission: EM | Admit: 2018-03-07 | Discharge: 2018-03-09 | Disposition: A | Discharge status: Home / Self Care | Payer: Self-pay | Attending: Family Medicine | Admitting: Family Medicine

## 2018-03-07 DIAGNOSIS — R739 Hyperglycemia, unspecified: Secondary | ICD-10-CM | POA: Insufficient documentation

## 2018-03-07 DIAGNOSIS — T888XXA Other specified complications of surgical and medical care, not elsewhere classified, initial encounter: Secondary | ICD-10-CM | POA: Insufficient documentation

## 2018-03-07 DIAGNOSIS — R4702 Dysphasia: Secondary | ICD-10-CM | POA: Insufficient documentation

## 2018-03-07 DIAGNOSIS — R34 Anuria and oliguria: Secondary | ICD-10-CM | POA: Insufficient documentation

## 2018-03-07 DIAGNOSIS — R1084 Generalized abdominal pain: Secondary | ICD-10-CM

## 2018-03-07 DIAGNOSIS — F329 Major depressive disorder, single episode, unspecified: Secondary | ICD-10-CM | POA: Insufficient documentation

## 2018-03-07 DIAGNOSIS — R1310 Dysphagia, unspecified: Principal | ICD-10-CM | POA: Insufficient documentation

## 2018-03-07 DIAGNOSIS — Y838 Other surgical procedures as the cause of abnormal reaction of the patient, or of later complication, without mention of misadventure at the time of the procedure: Secondary | ICD-10-CM | POA: Insufficient documentation

## 2018-03-07 DIAGNOSIS — F17210 Nicotine dependence, cigarettes, uncomplicated: Secondary | ICD-10-CM | POA: Insufficient documentation

## 2018-03-07 HISTORY — DX: Unspecified asthma, uncomplicated: J45.909

## 2018-03-07 LAB — CBC WITH DIFFERENTIAL/PLATELET
ABS IMMATURE GRANULOCYTES: 0.01 10*3/uL (ref 0.00–0.07)
BASOS PCT: 1 %
Basophils Absolute: 0.1 10*3/uL (ref 0.0–0.1)
EOS ABS: 0.2 10*3/uL (ref 0.0–0.5)
Eosinophils Relative: 3 %
HCT: 39.1 % (ref 36.0–46.0)
Hemoglobin: 13 g/dL (ref 12.0–15.0)
Immature Granulocytes: 0 %
Lymphocytes Relative: 46 %
Lymphs Abs: 3 10*3/uL (ref 0.7–4.0)
MCH: 30.2 pg (ref 26.0–34.0)
MCHC: 33.2 g/dL (ref 30.0–36.0)
MCV: 90.7 fL (ref 80.0–100.0)
MONO ABS: 0.5 10*3/uL (ref 0.1–1.0)
Monocytes Relative: 8 %
NEUTROS ABS: 2.7 10*3/uL (ref 1.7–7.7)
Neutrophils Relative %: 42 %
PLATELETS: 156 10*3/uL (ref 150–400)
RBC: 4.31 MIL/uL (ref 3.87–5.11)
RDW: 12.9 % (ref 11.5–15.5)
WBC: 6.4 10*3/uL (ref 4.0–10.5)
nRBC: 0 % (ref 0.0–0.2)

## 2018-03-07 LAB — BASIC METABOLIC PANEL
Anion gap: 9 (ref 5–15)
BUN: 12 mg/dL (ref 6–20)
CALCIUM: 9.2 mg/dL (ref 8.9–10.3)
CO2: 25 mmol/L (ref 22–32)
Chloride: 108 mmol/L (ref 98–111)
Creatinine, Ser: 0.82 mg/dL (ref 0.44–1.00)
GFR calc Af Amer: >60 mL/min (ref >60)
GLUCOSE: 121 mg/dL — AB (ref 70–99)
Potassium: 3.5 mmol/L (ref 3.5–5.1)
SODIUM: 142 mmol/L (ref 135–145)

## 2018-03-07 LAB — TROPONIN I

## 2018-03-07 MED ORDER — ONDANSETRON HCL 4 MG PO TABS: 4.0000 mg | ORAL_TABLET | Freq: Four times a day (QID) | ORAL | Status: DC | PRN

## 2018-03-07 MED ORDER — ONDANSETRON HCL 4 MG/2ML IJ SOLN
4.0000 mg | Freq: Four times a day (QID) | INTRAMUSCULAR | Status: DC | PRN
  Administered 2018-03-08 (×2): 4 mg via INTRAVENOUS
  Filled 2018-03-07 (×2): qty 2

## 2018-03-07 MED ORDER — MORPHINE SULFATE (PF) 4 MG/ML IV SOLN
4.0000 mg | Freq: Once | INTRAVENOUS | Status: AC
  Administered 2018-03-07: 4 mg via INTRAVENOUS
  Filled 2018-03-07: qty 1

## 2018-03-07 MED ORDER — HYDROMORPHONE HCL 1 MG/ML IJ SOLN
0.5000 mg | Freq: Once | INTRAMUSCULAR | Status: AC
  Administered 2018-03-07: 0.5 mg via INTRAVENOUS
  Filled 2018-03-07: qty 1

## 2018-03-07 MED ORDER — SODIUM CHLORIDE 0.9 % IV BOLUS
1000.0000 mL | Freq: Once | INTRAVENOUS | Status: AC
  Administered 2018-03-07: 1000 mL via INTRAVENOUS

## 2018-03-07 MED ORDER — ALBUTEROL SULFATE (2.5 MG/3ML) 0.083% IN NEBU: 3.0000 mL | INHALATION_SOLUTION | Freq: Four times a day (QID) | RESPIRATORY_TRACT | Status: DC | PRN

## 2018-03-07 MED ORDER — ACETAMINOPHEN 325 MG PO TABS
650.0000 mg | ORAL_TABLET | Freq: Four times a day (QID) | ORAL | Status: DC | PRN
  Administered 2018-03-09: 650 mg via ORAL
  Filled 2018-03-07: qty 2

## 2018-03-07 MED ORDER — DEXAMETHASONE SODIUM PHOSPHATE 10 MG/ML IJ SOLN
10.0000 mg | Freq: Once | INTRAMUSCULAR | Status: AC
  Administered 2018-03-07: 10 mg via INTRAVENOUS
  Filled 2018-03-07: qty 1

## 2018-03-07 MED ORDER — ESCITALOPRAM OXALATE 10 MG PO TABS
20.0000 mg | ORAL_TABLET | Freq: Every day | ORAL | Status: DC
  Filled 2018-03-07 (×3): qty 2

## 2018-03-07 MED ORDER — SENNOSIDES-DOCUSATE SODIUM 8.6-50 MG PO TABS: 2.0000 | ORAL_TABLET | Freq: Two times a day (BID) | ORAL | Status: DC | PRN

## 2018-03-07 MED ORDER — ACETAMINOPHEN 650 MG RE SUPP: 650.0000 mg | Freq: Four times a day (QID) | RECTAL | Status: DC | PRN

## 2018-03-07 MED ORDER — ONDANSETRON HCL 4 MG/2ML IJ SOLN
4.0000 mg | Freq: Once | INTRAMUSCULAR | Status: AC
  Administered 2018-03-07: 4 mg via INTRAVENOUS
  Filled 2018-03-07: qty 2

## 2018-03-07 MED ORDER — SODIUM CHLORIDE 0.9 % IV SOLN
INTRAVENOUS | Status: DC
  Administered 2018-03-07 – 2018-03-08 (×2): via INTRAVENOUS

## 2018-03-07 MED ORDER — KETOROLAC TROMETHAMINE 30 MG/ML IJ SOLN
30.0000 mg | Freq: Four times a day (QID) | INTRAMUSCULAR | Status: DC | PRN
  Administered 2018-03-07: 30 mg via INTRAVENOUS
  Filled 2018-03-07: qty 1

## 2018-03-07 MED ORDER — HYDROMORPHONE HCL 1 MG/ML IJ SOLN
0.5000 mg | INTRAMUSCULAR | Status: DC | PRN
  Administered 2018-03-07 – 2018-03-08 (×6): 0.5 mg via INTRAVENOUS
  Filled 2018-03-07 (×6): qty 0.5

## 2018-03-07 MED ORDER — ENOXAPARIN SODIUM 40 MG/0.4ML ~~LOC~~ SOLN
40.0000 mg | SUBCUTANEOUS | Status: DC
  Administered 2018-03-07 – 2018-03-08 (×2): 40 mg via SUBCUTANEOUS
  Filled 2018-03-07 (×2): qty 0.4

## 2018-03-07 NOTE — H&P (Signed)
[employer] [facility] [place] [facility]   PATIENT NAME: Alexis Gilbert    MR#:  [id number]  DATE OF BIRTH:  [birth year]  DATE OF ADMISSION:  03/07/2018  PRIMARY CARE PHYSICIAN: Patient, No Pcp Per   REQUESTING/REFERRING PHYSICIAN: Gladstone Pih, MD  CHIEF COMPLAINT:   Chief Complaint  Patient presents with  . Dysphagia    HISTORY OF PRESENT ILLNESS:  Alexis Gilbert  is a 47 y.o. female with no PMH who presented to the ED with difficulty swallowing for the last two days. She just had a laparoscopic hysterectomy 03/05/18 and has had throat pain and dysphagia since being intubated during that procedure. She is unable to take in any liquids or solids. She states she cannot even swallow her own saliva. She thought she may be having a worsening of her GERD, so she started pepcid, but this has not helped. She endorses cough and nausea. No vomiting.  In the ED, labs were unremarkable. The ED physician spoke with ENT, who recommended decadron and observation. Hospitalists were called for admission.  PAST MEDICAL HISTORY:  History reviewed. No pertinent past medical history.  PAST SURGICAL HISTORY:   Past Surgical History:  Procedure Laterality Date  . hemmorhoidectomy    . HERNIA REPAIR    . TUBAL LIGATION      SOCIAL HISTORY:   Social History   Tobacco Use  . Smoking status: Current Every Day Smoker    Packs/day: 0.30    Types: Cigarettes  . Smokeless tobacco: Never Used  Substance Use Topics  . Alcohol use: No    Frequency: Never    FAMILY HISTORY:  No family history on file.  DRUG ALLERGIES:   Allergies  Allergen Reactions  . Codeine Anaphylaxis  . Vicodin [Hydrocodone-Acetaminophen] Rash    REVIEW OF SYSTEMS:   Review of Systems  Constitutional: Negative for chills and fever.  HENT: Positive for sore throat. Negative for congestion.   Eyes: Negative for blurred vision and double vision.  Respiratory: Positive for cough. Negative for  shortness of breath and stridor.   Cardiovascular: Negative for chest pain, palpitations and leg swelling.  Gastrointestinal: Positive for abdominal pain and nausea. Negative for constipation, diarrhea and vomiting.  Genitourinary: Negative for dysuria and frequency.  Musculoskeletal: Negative for back pain and neck pain.  Neurological: Negative for dizziness and headaches.  Psychiatric/Behavioral: Negative for depression. The patient is not nervous/anxious.     MEDICATIONS AT HOME:   Prior to Admission medications   Medication Sig Start Date End Date Taking? Authorizing Provider  albuterol (PROVENTIL HFA;VENTOLIN HFA) 108 (90 Base) MCG/ACT inhaler Inhale 2 puffs into the lungs every 6 (six) hours as needed for wheezing or shortness of breath. 07/17/17   Don Perking, [place], MD  escitalopram (LEXAPRO) 20 MG tablet Take 1 tablet by mouth daily. 12/29/15   [provider]  levonorgestrel (MIRENA) 20 MCG/24HR IUD 1 each once by Intrauterine route.    [provider]      VITAL SIGNS:  Blood pressure 110/75, pulse 65, temperature 98.1 F (36.7 C), temperature source Oral, resp. rate 14, height 5\' 7"  (1.702 m), weight 87.5 kg, SpO2 95 %.  PHYSICAL EXAMINATION:  Physical Exam  GENERAL:  47 y.o.-year-old patient lying in the bed with no acute distress.  EYES: Pupils equal, round, reactive to light and accommodation. No scleral icterus. Extraocular muscles intact.  HEENT: Head atraumatic, normocephalic. Oropharynx and nasopharynx clear. Moist mucous membranes. NECK:  Supple, no jugular venous distention. No thyroid enlargement,  no tenderness.  LUNGS: Normal breath sounds bilaterally, no wheezing, rales,rhonchi or crepitation. No use of accessory muscles of respiration.  CARDIOVASCULAR: RRR, S1, S2 normal. No murmurs, rubs, or gallops.  ABDOMEN: Soft, nondistended. +three well-healing laparoscopic scars present (one on either side of the abdomen and one in the umbilicus).  +generalized tenderness to palpation, no rebound, no guarding. Bowel sounds present. No organomegaly or mass.  EXTREMITIES: No pedal edema, cyanosis, or clubbing.  NEUROLOGIC: Cranial nerves II through XII are intact. Muscle strength 5/5 in all extremities. Sensation intact. Gait not checked.  PSYCHIATRIC: The patient is alert and oriented x 3.  SKIN: No obvious rash, lesion, or ulcer.   LABORATORY PANEL:   CBC Recent Labs  Lab 03/07/18 1432  WBC 6.4  HGB 13.0  HCT 39.1  PLT 156   ------------------------------------------------------------------------------------------------------------------  Chemistries  Recent Labs  Lab 03/07/18 1432  NA 142  K 3.5  CL 108  CO2 25  GLUCOSE 121*  BUN 12  CREATININE 0.82  CALCIUM 9.2   ------------------------------------------------------------------------------------------------------------------  Cardiac Enzymes Recent Labs  Lab 03/07/18 1705  TROPONINI <0.03   ------------------------------------------------------------------------------------------------------------------  RADIOLOGY:  Dg Chest 2 View  Result Date: 03/07/2018 CLINICAL DATA:  Difficulty swallowing, severe abdominal pain, chest tightness. Not been able to eat or drink for 2 days. Current smoker. EXAM: CHEST - 2 VIEW COMPARISON:  Chest x-rays dated 12/02/2017 and 07/17/2017. FINDINGS: Heart size and mediastinal contours are within normal limits, stable. Lungs are clear. No pleural effusion or pneumothorax seen. No acute or suspicious osseous finding. IMPRESSION: No active cardiopulmonary disease. No evidence of pneumonia or pulmonary edema. Cardiomediastinal silhouette appears stable. Electronically Signed   By: Bary Richard M.D.   On: 03/07/2018 16:03      IMPRESSION AND PLAN:   Dysphagia- likely related to tracheal injury with recent history of intubation. No respiratory distress to suggest tracheal rupture. No stridor or hoarseness. - s/p decadron in the  ED - ENT consult - full liquid diet for now, will advance as tolerated - MIVFs  Recent laparoscopic hysterectomy- still having some residual abdominal pain, not relieved with morphine - tylenol and toradol - dilaudid for breakthrough pain  Hyperglycemia- blood sugar 121 in the ED - check a1c  Depression- stable - continue lexapro  All the records are reviewed and case discussed with ED provider. Management plans discussed with the patient, family and they are in agreement.  CODE STATUS: Full  TOTAL TIME TAKING CARE OF THIS PATIENT: 45 minutes.    Alexis Gilbert M.D on 03/07/2018 at 7:00 PM  Between 7am to 6pm - Pager [phone]  After 6pm go to [email or web address] - Social research officer, government  [facility] [place] [facility]  Office  [phone]  CC: Primary care physician; Patient, No Pcp Per   Note: This dictation was prepared with Dragon dictation along with smaller phrase technology. Any transcriptional errors that result from this process are unintentional.

## 2018-03-07 NOTE — Op Note (Signed)
03/07/2018  10:33 PM    [name]  [id number]   Pre-Op Dx: Oropharyngeal dysphasia  Post-op Dx: Oropharyngeal dysphasia secondary to intubation trauma  Proc: Flexible laryngoscopy  Surg:  [name] [name]  Anes:  GOT  EBL: None  Comp: None  Findings: Slight redness irritation inside the right arytenoid and right ary-epiglottic fold.  Slight redness and bruising in the right piriform sinus  Procedure: The patient was seen at the bedside.  Alexis Gilbert was given topical anesthesia using 4% Xylocaine mixed with Afrin to spray the nose.  The left side seem to be a little more open so the scope was passed through the left nostril into the hypopharynx and larynx.  Alexis Gilbert tolerated this well.  The nose and nasopharynx were clear although the adenoids were just slightly deeper pink than normal.  No sign of purulence anywhere.  Her nose did not show any signs of infection.  The hypopharynx showed a normal epiglottis that elevated well.  There is slight bruising in the right piriform sinus but not the left.  Posterior pharyngeal wall was clear and the area posterior arytenoid was clear as well.  No sign of redness or swelling here.  There was slight redness bruising inside the right arytenoid and right aryepiglottic fold.  This seems to be area of trauma.  Her vocal cords appeared to be pearly white and move well the midline and opened widely.  I do not see any mucosal tears anywhere just the bruising here.  The scope was removed and the patient tolerated the procedure very well.  This was done at the bedside.  There were no operative complications.   Plan: Patient apparently had some trauma during intubation that does cause some discomfort here.  This should settle down and relax on its own.  Alexis Gilbert did do some vomiting postop may have added to this but again it should resolve on its own.  I do not see anything that is obstructive or swollen around the larynx that would cause airway issues.  The mucosa  overall looks healthy and as her pain settles down some Alexis Gilbert should go back to swallowing totally normally.  [name] Serjio Deupree  03/07/2018 10:33 PM

## 2018-03-07 NOTE — ED Notes (Signed)
Pt reports to ED with c/c of difficulty swallowing, severe abdominal pain s/p hysterectomy 2 days ago. Pt reports that she feels a tightness in her esophagus which makes it both painful and difficult to swallow. She states that she has not been able to eat or drink since last Thursday. Denies any difficulty breathing. Denies any changes to urinary/bowel patterns.

## 2018-03-07 NOTE — ED Notes (Signed)
Lights dimmed for comfort.

## 2018-03-07 NOTE — ED Notes (Signed)
EDP at bedside at this time.  

## 2018-03-07 NOTE — ED Notes (Signed)
Report from megan, rn.  

## 2018-03-07 NOTE — ED Triage Notes (Signed)
States had hysterectomy 2 days ago. States yesterday started to be unable to swallow. States even when tries to swallow her own saliva she cannot. Comes back up and has to spit it out. No resp distress at present. Was intubated for surgery.

## 2018-03-07 NOTE — Consult Note (Signed)
Alexis Gilbert, Alexis Gilbert [id number] 04-Jul-1970 MayoAllyn Kenner, MD  Reason for Consult: Evaluate the throat because of dysphasia  HPI: The patient is a 47 year old white female who had laparoscopic hysterectomy the 03/05/2018 under general anesthesia.  She was intubated during the procedure.  She awoke and had some mild dysphasia.  She knows she was vomiting some postop in the recovery area.  She did not spit out any blood.  The next morning when she awoke her throat was so sore she could not swallow at all.  She thought it may have been worsening reflux so she started Pepcid but it did not help.  She is continued to have severe pain and unable to swallow anything at all.  She does have a little bit of cough and some mucus in her throat but is not vomiting currently.  She is never had any severe pain like this previously.  She is not short of breath and her voice is clear.  Assessment is made of her larynx and hypopharynx to look for any source of pain.  Allergies:  Allergies  Allergen Reactions  . Codeine Anaphylaxis  . Vicodin [Hydrocodone-Acetaminophen] Rash    ROS: Review of systems normal other than 12 systems except per HPI.  PMH:  Past Medical History:  Diagnosis Date  . Asthma     FH: History reviewed. No pertinent family history.  SH:  Social History   Socioeconomic History  . Marital status: Married    Spouse name: Not on file  . Number of children: Not on file  . Years of education: Not on file  . Highest education level: Not on file  Occupational History  . Not on file  Social Needs  . Financial resource strain: Not on file  . Food insecurity:    Worry: Not on file    Inability: Not on file  . Transportation needs:    Medical: Not on file    Non-medical: Not on file  Tobacco Use  . Smoking status: Current Every Day Smoker    Packs/day: 0.30    Types: Cigarettes  . Smokeless tobacco: Never Used  Substance and Sexual Activity  . Alcohol use: No    Frequency: Never   . Drug use: Not Currently  . Sexual activity: Not on file  Lifestyle  . Physical activity:    Days per week: Not on file    Minutes per session: Not on file  . Stress: Not on file  Relationships  . Social connections:    Talks on phone: Not on file    Gets together: Not on file    Attends religious service: Not on file    Active member of club or organization: Not on file    Attends meetings of clubs or organizations: Not on file    Relationship status: Not on file  . Intimate partner violence:    Fear of current or ex partner: Not on file    Emotionally abused: Not on file    Physically abused: Not on file    Forced sexual activity: Not on file  Other Topics Concern  . Not on file  Social History Narrative  . Not on file    PSH:  Past Surgical History:  Procedure Laterality Date  . ABDOMINAL HYSTERECTOMY    . hemmorhoidectomy    . HERNIA REPAIR    . TUBAL LIGATION      Physical  Exam: Well-developed and well-nourished white female in no acute distress.  CN 2-12 grossly  intact and symmetric. Oral cavity, lips, gums, ororpharynx normal with no masses or lesions.  Mucous membranes are moist.  Skin warm and dry. Nasal cavity without polyps or purulence. External nose and ears without masses or lesions. Neck supple with no masses or lesions. No lymphadenopathy palpated.  Flexible laryngoscopy is done with passing the scope through the left nostril.  This is dictated in detail elsewhere.  It shows slight redness inside the right arytenoid and aryepiglottic fold.  It also shows some redness and bruising in the right piriform sinus.  No other abnormalities noted.  There is good vocal cord mobility.  There is no mucus collecting anywhere currently.   A/P: She has had severe dysphasia since the day postop intubation.  This was 2 days ago.  She was started on some Decadron with slight improvement she is able to take liquids better now.  There is no sign of infection anywhere in the  hypopharynx or larynx.  I suspect this will continue to settle down over the next several days.  If she can tolerate liquids well enough tomorrow that she can go home on a 5 mg prednisone taper from 30 mg to 0/6 days.  If her symptoms resolved she does not need further ENT follow-up.     [name] Zacchaeus Halm 03/07/2018 10:25 PM

## 2018-03-07 NOTE — ED Notes (Signed)
Patient transported to X-ray 

## 2018-03-07 NOTE — ED Notes (Signed)
This RN to bedside, pt resting in bed with NAD. Pt noted to be maintaining her own secretions. Family at bedside. Pt asking where Dr. Imogene Burn is, this RN apologized, explained high number of admissions but MD was making his rounds. Pt states understanding, VSS and WNL.

## 2018-03-07 NOTE — ED Provider Notes (Addendum)
[facility] Emergency Department Provider Note  ___________________________________________   First MD Initiated Contact with Patient 03/07/18 1407     (approximate)  I have reviewed the triage vital signs and the nursing notes.   HISTORY  Chief Complaint Dysphagia   HPI Alexis Gilbert is a 47 y.o. female with a history of recent laparoscopic hysterectomy 2 days ago who was presented to the emergency department with worsening difficulty swallowing after being intubated during the procedure.  She states that she also has not been able to take any pills, fluids or solids since the procedure because of the throat pain and difficulty swallowing.  Says that she is having constant diffuse abdominal pain since the procedure which is unchanged.  Does not report any fever.   History reviewed. No pertinent past medical history.  There are no active problems to display for this patient.   Past Surgical History:  Procedure Laterality Date  . hemmorhoidectomy    . HERNIA REPAIR    . TUBAL LIGATION      Prior to Admission medications   Medication Sig Start Date End Date Taking? Authorizing Provider  albuterol (PROVENTIL HFA;VENTOLIN HFA) 108 (90 Base) MCG/ACT inhaler Inhale 2 puffs into the lungs every 6 (six) hours as needed for wheezing or shortness of breath. 07/17/17   Don Perking, [place], MD  escitalopram (LEXAPRO) 20 MG tablet Take 1 tablet by mouth daily. 12/29/15   [provider]  levonorgestrel (MIRENA) 20 MCG/24HR IUD 1 each once by Intrauterine route.    [provider]    Allergies Codeine and Vicodin [hydrocodone-acetaminophen]  No family history on file.  Social History Social History   Tobacco Use  . Smoking status: Current Every Day Smoker    Packs/day: 0.30    Types: Cigarettes  . Smokeless tobacco: Never Used  Substance Use Topics  . Alcohol use: No    Frequency: Never  . Drug use: Not on file    Review of  Systems  Constitutional: No fever/chills Eyes: No visual changes. ENT: As above Cardiovascular: Denies chest pain. Respiratory: Denies shortness of breath. Gastrointestinal: No abdominal pain.  No nausea, no vomiting.  No diarrhea.  No constipation. Genitourinary: Negative for dysuria. Musculoskeletal: Negative for back pain. Skin: Negative for rash. Neurological: Negative for headaches, focal weakness or numbness.  ____________________________________________   PHYSICAL EXAM:  VITAL SIGNS: ED Triage Vitals  Enc Vitals Group     BP 03/07/18 1402 122/75     Pulse Rate 03/07/18 1402 83     Resp 03/07/18 1402 18     Temp 03/07/18 1402 97.9 F (36.6 C)     Temp Source 03/07/18 1402 Oral     SpO2 03/07/18 1402 97 %     Weight 03/07/18 1404 193 lb (87.5 kg)     Height 03/07/18 1404 5\' 7"  (1.702 m)     Head Circumference --      Peak Flow --      Pain Score 03/07/18 1404 10     Pain Loc --      Pain Edu? --      Excl. in GC? --     Constitutional: Alert and oriented. Well appearing and in no acute distress.  No respiratory distress.  Normal voice. Eyes: Conjunctivae are normal.  Head: Atraumatic. Nose: No congestion/rhinnorhea. Mouth/Throat: Mucous membranes are moist.  No swelling to the tongue or the structures of the posterior pharynx. Neck: No stridor.   Cardiovascular: Normal rate, regular rhythm. Grossly  normal heart sounds.  Respiratory: Normal respiratory effort.  No retractions. Lungs CTAB. Gastrointestinal: Soft with moderate tenderness to palpation throughout the abdomen without rebound or guarding. No distention.  Musculoskeletal: No lower extremity tenderness nor edema.  No joint effusions. Neurologic:  Normal speech and language. No gross focal neurologic deficits are appreciated. Skin:  Skin is warm, dry and intact. No rash noted. Psychiatric: Mood and affect are normal. Speech and behavior are normal.  ____________________________________________    LABS (all labs ordered are listed, but only abnormal results are displayed)  Labs Reviewed  BASIC METABOLIC PANEL - Abnormal; Notable for the following components:      Result Value   Glucose, Bld 121 (*)    All other components within normal limits  CBC WITH DIFFERENTIAL/PLATELET  TROPONIN I   ____________________________________________  EKG  ED ECG REPORT I, Arelia Longest, the attending physician, personally viewed and interpreted this ECG.   Date: 03/07/2018  EKG Time: 1743  Rate: 62  Rhythm: normal sinus rhythm  Axis: Normal  Intervals:none  ST&T Change: No ST segment elevation or depression.  No abnormal T wave inversion.  ____________________________________________  RADIOLOGY  Chest x-ray without active cardiopulmonary disease. ____________________________________________   PROCEDURES  Procedure(s) performed:   Procedures  Critical Care performed:   ____________________________________________   INITIAL IMPRESSION / ASSESSMENT AND PLAN / ED COURSE  Pertinent labs & imaging results that were available during my care of the patient were reviewed by me and considered in my medical decision making (see chart for details).  DDX: Tracheal trauma, post intubation inflammatory response, tracheal tear, dysphasia As part of my medical decision making, I reviewed the following data within the electronic MEDICAL RECORD NUMBER Notes from prior ED visits as well as recent operative report.  ----------------------------------------- 5:30 PM on 03/07/2018 -----------------------------------------  Patient at this time says that she still is unable to swallow despite having Decadron given about 2 hours ago.  Also asking for repeat dose of pain medicine for her abdomen.  Patient to be admitted to the hospital secondary to unable to tolerate p.o.  Ordered EKG as well as troponin as well for further work-up.  However, the history and physical are consistent more with  tracheal injury from intubation.  Patient continues to control her secretions.  Not in any respiratory distress.  Speaks in a normal voice.  Reassuring vital signs.  Very likely be pulmonary embolus.  Patient understand the diagnosis and treatment plan willing to comply.  Signed out to Dr. Nancy Marus. ____________________________________________   FINAL CLINICAL IMPRESSION(S) / ED DIAGNOSES  Postop abdominal pain.  Dysphasia.  NEW MEDICATIONS STARTED DURING THIS VISIT:  New Prescriptions   No medications on file     Note:  This document was prepared using Dragon voice recognition software and may include unintentional dictation errors.     Myrna Blazer, MD 03/07/18 1731    Pershing Proud, Myra Rude, MD 03/07/18 [phone]

## 2018-03-08 ENCOUNTER — Observation Stay: Payer: Self-pay

## 2018-03-08 ENCOUNTER — Other Ambulatory Visit: Payer: Self-pay

## 2018-03-08 LAB — CBC
HEMATOCRIT: 35.8 % — AB (ref 36.0–46.0)
HEMOGLOBIN: 11.8 g/dL — AB (ref 12.0–15.0)
MCH: 29.9 pg (ref 26.0–34.0)
MCHC: 33 g/dL (ref 30.0–36.0)
MCV: 90.6 fL (ref 80.0–100.0)
Platelets: 148 10*3/uL — ABNORMAL LOW (ref 150–400)
RBC: 3.95 MIL/uL (ref 3.87–5.11)
RDW: 12.5 % (ref 11.5–15.5)
WBC: 9 10*3/uL (ref 4.0–10.5)
nRBC: 0 % (ref 0.0–0.2)

## 2018-03-08 LAB — URINALYSIS, ROUTINE W REFLEX MICROSCOPIC
Bacteria, UA: NONE SEEN
Bilirubin Urine: NEGATIVE
GLUCOSE, UA: NEGATIVE mg/dL
Ketones, ur: NEGATIVE mg/dL
Nitrite: NEGATIVE
Protein, ur: 30 mg/dL — AB
RBC / HPF: >50 RBC/hpf — ABNORMAL HIGH (ref 0–5)
Specific Gravity, Urine: 1.025 (ref 1.005–1.030)
pH: 5 (ref 5.0–8.0)

## 2018-03-08 LAB — HEMOGLOBIN A1C
HEMOGLOBIN A1C: 5.4 % (ref 4.8–5.6)
MEAN PLASMA GLUCOSE: 108.28 mg/dL

## 2018-03-08 LAB — BASIC METABOLIC PANEL
ANION GAP: 7 (ref 5–15)
BUN: 16 mg/dL (ref 6–20)
CHLORIDE: 109 mmol/L (ref 98–111)
CO2: 23 mmol/L (ref 22–32)
Calcium: 8.7 mg/dL — ABNORMAL LOW (ref 8.9–10.3)
Creatinine, Ser: 0.86 mg/dL (ref 0.44–1.00)
Glucose, Bld: 144 mg/dL — ABNORMAL HIGH (ref 70–99)
POTASSIUM: 4.2 mmol/L (ref 3.5–5.1)
SODIUM: 139 mmol/L (ref 135–145)

## 2018-03-08 LAB — PROTEIN / CREATININE RATIO, URINE
CREATININE, URINE: 272 mg/dL
PROTEIN CREATININE RATIO: 0.13 mg/mg{creat} (ref 0.00–0.15)
TOTAL PROTEIN, URINE: 34 mg/dL

## 2018-03-08 MED ORDER — PREDNISONE 10 MG (21) PO TBPK
10.0000 mg | ORAL_TABLET | Freq: Three times a day (TID) | ORAL | Status: DC
  Administered 2018-03-09: 10 mg via ORAL

## 2018-03-08 MED ORDER — DIPHENHYDRAMINE HCL 25 MG PO CAPS
25.0000 mg | ORAL_CAPSULE | Freq: Every evening | ORAL | Status: DC | PRN
  Administered 2018-03-08: 25 mg via ORAL
  Filled 2018-03-08: qty 1

## 2018-03-08 MED ORDER — PREDNISONE 10 MG (21) PO TBPK: 10.0000 mg | ORAL_TABLET | Freq: Four times a day (QID) | ORAL | Status: DC

## 2018-03-08 MED ORDER — PREDNISONE 10 MG (21) PO TBPK: 20.0000 mg | ORAL_TABLET | Freq: Every evening | ORAL | Status: DC

## 2018-03-08 MED ORDER — PREDNISONE 10 MG (21) PO TBPK
20.0000 mg | ORAL_TABLET | Freq: Every evening | ORAL | Status: AC
  Administered 2018-03-08: 20 mg via ORAL

## 2018-03-08 MED ORDER — PREDNISONE 10 MG (21) PO TBPK
10.0000 mg | ORAL_TABLET | ORAL | Status: AC
  Administered 2018-03-08: 10 mg via ORAL

## 2018-03-08 MED ORDER — PREDNISONE 10 MG (21) PO TBPK
20.0000 mg | ORAL_TABLET | Freq: Every morning | ORAL | Status: AC
  Administered 2018-03-08: 20 mg via ORAL
  Filled 2018-03-08: qty 21

## 2018-03-08 MED ORDER — PHENOL 1.4 % MT LIQD
1.0000 | OROMUCOSAL | Status: DC | PRN
  Filled 2018-03-08: qty 177

## 2018-03-08 NOTE — Consult Note (Signed)
Date: 03/08/2018                  Patient Name:  Alexis Gilbert  MRN: [id number]  DOB: 1970-09-21  Age / Sex: 47 y.o., female         PCP: Patient, No Pcp Per                 Service Requesting Consult: IM/ Salary, Evelena Asa, MD                 Reason for Consult: oliguria            History of Present Illness: Patient is a 47 y.o. female who was admitted to [facility] on 03/07/2018 for evaluation of difficulty swallowing.  Patient underwent laparoscopic hysterectomy on October 10 under general anesthesia.  She has dysphagia postoperatively.  She has been evaluated by ENT specialist.  It is thought to be secondary to intubation trauma.  Patient reports that she was not able have much oral intake since her surgery.  She reported oliguria and feeling fullness in her thighs from fluid Nephrology consult has been requested for evaluation   Medications: Outpatient medications: Medications Prior to Admission  Medication Sig Dispense Refill Last Dose  . albuterol (PROVENTIL HFA;VENTOLIN HFA) 108 (90 Base) MCG/ACT inhaler Inhale 2 puffs into the lungs every 6 (six) hours as needed for wheezing or shortness of breath. (Patient not taking: Reported on 03/07/2018) 1 Inhaler 2 Not Taking at Unknown time  . escitalopram (LEXAPRO) 20 MG tablet Take 1 tablet by mouth daily.   Not Taking at Unknown time  . levonorgestrel (MIRENA) 20 MCG/24HR IUD 1 each once by Intrauterine route.   Not Taking at Unknown time    Current medications: Current Facility-Administered Medications  Medication Dose Route Frequency Provider Last Rate Last Dose  . acetaminophen (TYLENOL) tablet 650 mg  650 mg Oral Q6H PRN Mayo, Allyn Kenner, MD       Or  . acetaminophen (TYLENOL) suppository 650 mg  650 mg Rectal Q6H PRN Mayo, Allyn Kenner, MD      . albuterol (PROVENTIL) (2.5 MG/3ML) 0.083% nebulizer solution 3 mL  3 mL Inhalation Q6H PRN Mayo, Allyn Kenner, MD      . enoxaparin (LOVENOX) injection 40 mg  40 mg Subcutaneous Q24H  Mayo, Allyn Kenner, MD   40 mg at 03/07/18 2204  . escitalopram (LEXAPRO) tablet 20 mg  20 mg Oral Daily Mayo, Allyn Kenner, MD      . HYDROmorphone (DILAUDID) injection 0.5 mg  0.5 mg Intravenous Q4H PRN Mayo, Allyn Kenner, MD   0.5 mg at 03/08/18 1157  . ketorolac (TORADOL) 30 MG/ML injection 30 mg  30 mg Intravenous Q6H PRN Mayo, Allyn Kenner, MD   30 mg at 03/07/18 2033  . ondansetron (ZOFRAN) tablet 4 mg  4 mg Oral Q6H PRN Mayo, Allyn Kenner, MD       Or  . ondansetron [facility]) injection 4 mg  4 mg Intravenous Q6H PRN Mayo, Allyn Kenner, MD   4 mg at 03/08/18 0746  . phenol (CHLORASEPTIC) mouth spray 1 spray  1 spray Mouth/Throat PRN Salary, Montell D, MD      . predniSONE (STERAPRED UNI-PAK 21 TAB) tablet 10 mg  10 mg Oral PC lunch Salary, Montell D, MD      . predniSONE (STERAPRED UNI-PAK 21 TAB) tablet 10 mg  10 mg Oral PC supper Salary, Evelena Asa, MD      . Melene Muller ON 03/09/2018]  predniSONE (STERAPRED UNI-PAK 21 TAB) tablet 10 mg  10 mg Oral 3 x daily with food Salary, Montell D, MD      . Melene Muller ON 03/10/2018] predniSONE (STERAPRED UNI-PAK 21 TAB) tablet 10 mg  10 mg Oral 4X daily taper Salary, Montell D, MD      . predniSONE (STERAPRED UNI-PAK 21 TAB) tablet 20 mg  20 mg Oral AC breakfast Salary, Montell D, MD      . predniSONE (STERAPRED UNI-PAK 21 TAB) tablet 20 mg  20 mg Oral Nightly Salary, Montell D, MD      . Melene Muller ON 03/09/2018] predniSONE (STERAPRED UNI-PAK 21 TAB) tablet 20 mg  20 mg Oral Nightly Salary, Montell D, MD      . senna-docusate (Senokot-S) tablet 2 tablet  2 tablet Oral BID PRN Mayo, Allyn Kenner, MD          Allergies: Allergies  Allergen Reactions  . Codeine Anaphylaxis  . Vicodin [Hydrocodone-Acetaminophen] Rash      Past Medical History: Past Medical History:  Diagnosis Date  . Asthma      Past Surgical History: Past Surgical History:  Procedure Laterality Date  . ABDOMINAL HYSTERECTOMY    . hemmorhoidectomy    . HERNIA REPAIR    . TUBAL LIGATION        Family History: History reviewed. No pertinent family history.   Social History: Social History   Socioeconomic History  . Marital status: Married    Spouse name: Not on file  . Number of children: Not on file  . Years of education: Not on file  . Highest education level: Not on file  Occupational History  . Not on file  Social Needs  . Financial resource strain: Not on file  . Food insecurity:    Worry: Not on file    Inability: Not on file  . Transportation needs:    Medical: Not on file    Non-medical: Not on file  Tobacco Use  . Smoking status: Current Every Day Smoker    Packs/day: 0.30    Types: Cigarettes  . Smokeless tobacco: Never Used  Substance and Sexual Activity  . Alcohol use: No    Frequency: Never  . Drug use: Not Currently  . Sexual activity: Not on file  Lifestyle  . Physical activity:    Days per week: Not on file    Minutes per session: Not on file  . Stress: Not on file  Relationships  . Social connections:    Talks on phone: Not on file    Gets together: Not on file    Attends religious service: Not on file    Active member of club or organization: Not on file    Attends meetings of clubs or organizations: Not on file    Relationship status: Not on file  . Intimate partner violence:    Fear of current or ex partner: Not on file    Emotionally abused: Not on file    Physically abused: Not on file    Forced sexual activity: Not on file  Other Topics Concern  . Not on file  Social History Narrative  . Not on file     Review of Systems: Gen: No fevers or chills HEENT: No vision or hearing problems CV: No chest pain or shortness of breath Resp: No cough or sputum production GI: Decreased oral intake due to dysphagia GU : Decreased urine output MS: No acute joint pains or swelling Derm:  No acute  rashes Psych: No complaints Heme: No complaints Neuro: No complaints Endocrine.  No complaints  Vital Signs: Blood pressure  104/76, pulse 67, temperature 97.8 F (36.6 C), temperature source Oral, resp. rate 18, height 5\' 10"  (1.778 m), weight 88.5 kg, SpO2 97 %.   Intake/Output Summary (Last 24 hours) at 03/08/2018 1249 Last data filed at 03/08/2018 0600 Gross per 24 hour  Intake 2573.12 ml  Output 325 ml  Net 2248.12 ml    Weight trends: Filed Weights   03/07/18 1404 03/07/18 2001  Weight: 87.5 kg 88.5 kg    Physical Exam: General:  No acute distress, laying in the bed  HEENT  anicteric, moist oral mucous membranes  Neck:  Supple, no JVD  Lungs:  Normal breathing effort, clear to auscultation  Heart::  Regular rate and rhythm   Abdomen: soft, nontender  Extremities:  No peripheral edema  Neurologic:  Alert, oriented  Skin:  No acute rashes             Lab results: Basic Metabolic Panel: Recent Labs  Lab 03/07/18 1432 03/08/18 0350  NA 142 139  K 3.5 4.2  CL 108 109  CO2 25 23  GLUCOSE 121* 144*  BUN 12 16  CREATININE 0.82 0.86  CALCIUM 9.2 8.7*    Liver Function Tests: No results for input(s): AST, ALT, ALKPHOS, BILITOT, PROT, ALBUMIN in the last 168 hours. No results for input(s): LIPASE, AMYLASE in the last 168 hours. No results for input(s): AMMONIA in the last 168 hours.  CBC: Recent Labs  Lab 03/07/18 1432 03/08/18 0350  WBC 6.4 9.0  NEUTROABS 2.7  --   HGB 13.0 11.8*  HCT 39.1 35.8*  MCV 90.7 90.6  PLT 156 148*    Cardiac Enzymes: Recent Labs  Lab 03/07/18 1705  TROPONINI <0.03    BNP: Invalid input(s): POCBNP  CBG: No results for input(s): GLUCAP in the last 168 hours.  Microbiology: No results found for this or any previous visit (from the past 720 hour(s)).   Coagulation Studies: No results for input(s): LABPROT, INR in the last 72 hours.  Urinalysis: No results for input(s): COLORURINE, LABSPEC, PHURINE, GLUCOSEU, HGBUR, BILIRUBINUR, KETONESUR, PROTEINUR, UROBILINOGEN, NITRITE, LEUKOCYTESUR in the last 72 hours.  Invalid input(s):  APPERANCEUR      Imaging: Dg Chest 2 View  Result Date: 03/07/2018 CLINICAL DATA:  Difficulty swallowing, severe abdominal pain, chest tightness. Not been able to eat or drink for 2 days. Current smoker. EXAM: CHEST - 2 VIEW COMPARISON:  Chest x-rays dated 12/02/2017 and 07/17/2017. FINDINGS: Heart size and mediastinal contours are within normal limits, stable. Lungs are clear. No pleural effusion or pneumothorax seen. No acute or suspicious osseous finding. IMPRESSION: No active cardiopulmonary disease. No evidence of pneumonia or pulmonary edema. Cardiomediastinal silhouette appears stable. Electronically Signed   By: Bary Richard M.D.   On: 03/07/2018 16:03      Assessment & Plan: Pt is a 47 y.o. Caucasian  female  was admitted on 03/07/2018 with severe dysphagia status post laparoscopic hysterectomy which was done on October 10.   1.  Oliguria Which persists despite patient getting 2.5 L of IV fluid Her serum creatinine is normal.  Renal ultrasound is pending.  Urinalysis is pending.  Seems like oliguria may be related to volume depletion and dehydration. Plan: Avoid nephrotoxins such as IV/IM Toradol Continue maintenance IV fluids Oral intake is starting to improve We will continue to monitor closely     LOS: 0 Gabriele Zwilling 10/13/201912:49  PM  [facility] [address] [place], [place] [phone]  Note: This note was prepared with Dragon dictation. Any transcription errors are unintentional

## 2018-03-08 NOTE — Progress Notes (Signed)
   03/08/18 1900  Clinical Encounter Type  Visited With Patient  Visit Type Initial;Spiritual support  Referral From Patient  Recommendations Follow-up as requested.  Spiritual Encounters  Spiritual Needs Emotional;Prayer;Ritual  Stress Factors  Patient Stress Factors Health changes;Family relationships   Chaplain encountered patient in the hallway. She requested a visit for herself and her sister who is also a patient. Chaplain met the patient and discussed her recent health concerns and martial challenges. Chaplain anointed and blessed the patient and offered pastoral presence and active listening.

## 2018-03-08 NOTE — Progress Notes (Signed)
Since admission has taken broth, po liquids well, pudding. No complaints of swallowing difficulty

## 2018-03-08 NOTE — Progress Notes (Signed)
[facility] - [place] at [facility]   PATIENT NAME: Alexis Gilbert    MR#:  [id number]  DATE OF BIRTH:  10-28-1970  SUBJECTIVE:  CHIEF COMPLAINT:   Chief Complaint  Patient presents with  . Dysphagia  Patient continues to complain of severe throat pain, difficulty with swallowing, now complains of not being much, generalized weakness/fatigue  REVIEW OF SYSTEMS:  CONSTITUTIONAL: No fever, fatigue or weakness.  EYES: No blurred or double vision.  EARS, NOSE, AND THROAT: No tinnitus or ear pain.  RESPIRATORY: No cough, shortness of breath, wheezing or hemoptysis.  CARDIOVASCULAR: No chest pain, orthopnea, edema.  GASTROINTESTINAL: No nausea, vomiting, diarrhea or abdominal pain.  GENITOURINARY: No dysuria, hematuria.  ENDOCRINE: No polyuria, nocturia,  HEMATOLOGY: No anemia, easy bruising or bleeding SKIN: No rash or lesion. MUSCULOSKELETAL: No joint pain or arthritis.   NEUROLOGIC: No tingling, numbness, weakness.  PSYCHIATRY: No anxiety or depression.   ROS  DRUG ALLERGIES:   Allergies  Allergen Reactions  . Codeine Anaphylaxis  . Vicodin [Hydrocodone-Acetaminophen] Rash    VITALS:  Blood pressure 104/76, pulse 67, temperature 97.8 F (36.6 C), temperature source Oral, resp. rate 18, height 5\' 10"  (1.778 m), weight 88.5 kg, SpO2 97 %.  PHYSICAL EXAMINATION:  GENERAL:  47 y.o.-year-old patient lying in the bed with no acute distress.  EYES: Pupils equal, round, reactive to light and accommodation. No scleral icterus. Extraocular muscles intact.  HEENT: Head atraumatic, normocephalic. Oropharynx and nasopharynx clear.  NECK:  Supple, no jugular venous distention. No thyroid enlargement, no tenderness.  LUNGS: Normal breath sounds bilaterally, no wheezing, rales,rhonchi or crepitation. No use of accessory muscles of respiration.  CARDIOVASCULAR: S1, S2 normal. No murmurs, rubs, or gallops.  ABDOMEN: Soft, nontender, nondistended. Bowel sounds present. No  organomegaly or mass.  EXTREMITIES: No pedal edema, cyanosis, or clubbing.  NEUROLOGIC: Cranial nerves II through XII are intact. Muscle strength 5/5 in all extremities. Sensation intact. Gait not checked.  PSYCHIATRIC: The patient is alert and oriented x 3.  SKIN: No obvious rash, lesion, or ulcer.   Physical Exam LABORATORY PANEL:   CBC Recent Labs  Lab 03/08/18 0350  WBC 9.0  HGB 11.8*  HCT 35.8*  PLT 148*   ------------------------------------------------------------------------------------------------------------------  Chemistries  Recent Labs  Lab 03/08/18 0350  NA 139  K 4.2  CL 109  CO2 23  GLUCOSE 144*  BUN 16  CREATININE 0.86  CALCIUM 8.7*   ------------------------------------------------------------------------------------------------------------------  Cardiac Enzymes Recent Labs  Lab 03/07/18 1705  TROPONINI <0.03   ------------------------------------------------------------------------------------------------------------------  RADIOLOGY:  Dg Chest 2 View  Result Date: 03/07/2018 CLINICAL DATA:  Difficulty swallowing, severe abdominal pain, chest tightness. Not been able to eat or drink for 2 days. Current smoker. EXAM: CHEST - 2 VIEW COMPARISON:  Chest x-rays dated 12/02/2017 and 07/17/2017. FINDINGS: Heart size and mediastinal contours are within normal limits, stable. Lungs are clear. No pleural effusion or pneumothorax seen. No acute or suspicious osseous finding. IMPRESSION: No active cardiopulmonary disease. No evidence of pneumonia or pulmonary edema. Cardiomediastinal silhouette appears stable. Electronically Signed   By: Bary Richard M.D.   On: 03/07/2018 16:03    ASSESSMENT AND PLAN:  *Acute Dysphagia Unimproved per patient ENT input greatly appreciated, continue clear liquid diet, strict I&O monitoring, Chloraseptic spray, adult pain protocol increase activity/ambulate, treated with IV Decadron, start prednisone taper, and continue  close medical monitoring  *Acute oliguria Patient with complaints of not being much Start strict I&O monitoring, daily weights, check renal ultrasound, nephrology for  expert opinion  *Recent laparoscopic hysterectomy Recovering well Continue adult pain protocol Need to follow-up with OB/GYN status post discharge for continued care/medical management  *Chronic depression Stable Continue lexapro  Disposition to home on tomorrow barring any complications   All the records are reviewed and case discussed with Care Management/Social Workerr. Management plans discussed with the patient, family and they are in agreement.  CODE STATUS: full  TOTAL TIME TAKING CARE OF THIS PATIENT: 40 minutes.     POSSIBLE D/C IN 1 DAYS, DEPENDING ON CLINICAL CONDITION.   Evelena Asa Dawnna Gritz M.D on 03/08/2018   Between 7am to 6pm - Pager - [phone]  After 6pm go to [email or web address] - password [email or web address]  [facility]  Office  [phone]  CC: Primary care physician; Patient, No Pcp Per  Note: This dictation was prepared with Dragon dictation along with smaller phrase technology. Any transcriptional errors that result from this process are unintentional.

## 2018-03-09 MED ORDER — TRAMADOL HCL 50 MG PO TABS: 50.0000 mg | ORAL_TABLET | Freq: Four times a day (QID) | ORAL | Status: DC | PRN

## 2018-03-09 MED ORDER — PHENOL 1.4 % MT LIQD: 1.0000 | OROMUCOSAL | 0 refills | Status: DC | PRN

## 2018-03-09 MED ORDER — PREDNISONE 10 MG (21) PO TBPK: ORAL_TABLET | ORAL | Status: DC

## 2018-03-09 MED ORDER — OXYCODONE HCL 5 MG PO TABS: 5.0000 mg | ORAL_TABLET | Freq: Four times a day (QID) | ORAL | Status: DC | PRN

## 2018-03-09 NOTE — Discharge Summary (Signed)
[facility] - [place] at [facility]   PATIENT NAME: Alexis Gilbert    MR#:  [id number]  DATE OF BIRTH:  11-Jan-1971  DATE OF ADMISSION:  03/07/2018 ADMITTING PHYSICIAN: Campbell Stall, MD  DATE OF DISCHARGE: No discharge date for patient encounter.  PRIMARY CARE PHYSICIAN: Patient, No Pcp Per    ADMISSION DIAGNOSIS:  Generalized abdominal pain [R10.84] Dysphagia, unspecified type [R13.10]  DISCHARGE DIAGNOSIS:  Active Problems:   Dysphagia   SECONDARY DIAGNOSIS:   Past Medical History:  Diagnosis Date  . Asthma     HOSPITAL COURSE:  *Acute Dysphagia Resolved ENT did see patient while in house-thought to be due to traumatic intubation from recent surgery, patient able to tolerate a diet, treated with steroids, and patient did well  *Acute oliguria Resolved with IV fluids for rehydration Renal ultrasound was normal, nephrologydid see patient while in house  *Recent laparoscopic hysterectomy Recovering well Continued adult pain protocol Need to follow-up with OB/GYN status post discharge for continued care/medical management  *Chronic depression Stable Continued lexapro   DISCHARGE CONDITIONS:   stable  CONSULTS OBTAINED:  Treatment Team:  Vernie Murders, MD Mosetta Pigeon, MD  DRUG ALLERGIES:   Allergies  Allergen Reactions  . Codeine Anaphylaxis  . Vicodin [Hydrocodone-Acetaminophen] Rash    DISCHARGE MEDICATIONS:   Allergies as of 03/09/2018      Reactions   Codeine Anaphylaxis   Vicodin [hydrocodone-acetaminophen] Rash      Medication List    TAKE these medications   albuterol 108 (90 Base) MCG/ACT inhaler Commonly known as:  PROVENTIL HFA;VENTOLIN HFA Inhale 2 puffs into the lungs every 6 (six) hours as needed for wheezing or shortness of breath.   escitalopram 20 MG tablet Commonly known as:  LEXAPRO Take 1 tablet by mouth daily.   levonorgestrel 20 MCG/24HR IUD Commonly known as:  MIRENA 1 each once  by Intrauterine route.   phenol 1.4 % Liqd Commonly known as:  CHLORASEPTIC Use as directed 1 spray in the mouth or throat as needed for throat irritation / pain.   predniSONE 10 MG (21) Tbpk tablet Commonly known as:  STERAPRED UNI-PAK 21 TAB As directed        DISCHARGE INSTRUCTIONS:  If you experience worsening of your admission symptoms, develop shortness of breath, life threatening emergency, suicidal or homicidal thoughts you must seek medical attention immediately by calling 911 or calling your MD immediately  if symptoms less severe.  You Must read complete instructions/literature along with all the possible adverse reactions/side effects for all the Medicines you take and that have been prescribed to you. Take any new Medicines after you have completely understood and accept all the possible adverse reactions/side effects.   Please note  You were cared for by a hospitalist during your hospital stay. If you have any questions about your discharge medications or the care you received while you were in the hospital after you are discharged, you can call the unit and asked to speak with the hospitalist on call if the hospitalist that took care of you is not available. Once you are discharged, your primary care physician will handle any further medical issues. Please note that NO REFILLS for any discharge medications will be authorized once you are discharged, as it is imperative that you return to your primary care physician (or establish a relationship with a primary care physician if you do not have one) for your aftercare needs so that they can reassess your need for medications and  monitor your lab values.    Today   CHIEF COMPLAINT:   Chief Complaint  Patient presents with  . Dysphagia    HISTORY OF PRESENT ILLNESS:  47 y.o. female with no PMH who presented to the ED with difficulty swallowing for the last two days. She just had a laparoscopic hysterectomy 03/05/18 and  has had throat pain and dysphagia since being intubated during that procedure. She is unable to take in any liquids or solids. She states she cannot even swallow her own saliva. She thought she may be having a worsening of her GERD, so she started pepcid, but this has not helped. She endorses cough and nausea. No vomiting.  In the ED, labs were unremarkable. The ED physician spoke with ENT, who recommended decadron and observation. Hospitalists were called for admission.   VITAL SIGNS:  Blood pressure 115/80, pulse 69, temperature 97.9 F (36.6 C), temperature source Oral, resp. rate 16, height 5\' 10"  (1.778 m), weight 90.8 kg, SpO2 98 %.  I/O:    Intake/Output Summary (Last 24 hours) at 03/09/2018 0931 Last data filed at 03/09/2018 0539 Gross per 24 hour  Intake 480 ml  Output 1900 ml  Net -1420 ml    PHYSICAL EXAMINATION:  GENERAL:  47 y.o.-year-old patient lying in the bed with no acute distress.  EYES: Pupils equal, round, reactive to light and accommodation. No scleral icterus. Extraocular muscles intact.  HEENT: Head atraumatic, normocephalic. Oropharynx and nasopharynx clear.  NECK:  Supple, no jugular venous distention. No thyroid enlargement, no tenderness.  LUNGS: Normal breath sounds bilaterally, no wheezing, rales,rhonchi or crepitation. No use of accessory muscles of respiration.  CARDIOVASCULAR: S1, S2 normal. No murmurs, rubs, or gallops.  ABDOMEN: Soft, non-tender, non-distended. Bowel sounds present. No organomegaly or mass.  EXTREMITIES: No pedal edema, cyanosis, or clubbing.  NEUROLOGIC: Cranial nerves II through XII are intact. Muscle strength 5/5 in all extremities. Sensation intact. Gait not checked.  PSYCHIATRIC: The patient is alert and oriented x 3.  SKIN: No obvious rash, lesion, or ulcer.   DATA REVIEW:   CBC Recent Labs  Lab 03/08/18 0350  WBC 9.0  HGB 11.8*  HCT 35.8*  PLT 148*    Chemistries  Recent Labs  Lab 03/08/18 0350  NA 139  K  4.2  CL 109  CO2 23  GLUCOSE 144*  BUN 16  CREATININE 0.86  CALCIUM 8.7*    Cardiac Enzymes Recent Labs  Lab 03/07/18 1705  TROPONINI <0.03    Microbiology Results  No results found for this or any previous visit.  RADIOLOGY:  Dg Chest 2 View  Result Date: 03/07/2018 CLINICAL DATA:  Difficulty swallowing, severe abdominal pain, chest tightness. Not been able to eat or drink for 2 days. Current smoker. EXAM: CHEST - 2 VIEW COMPARISON:  Chest x-rays dated 12/02/2017 and 07/17/2017. FINDINGS: Heart size and mediastinal contours are within normal limits, stable. Lungs are clear. No pleural effusion or pneumothorax seen. No acute or suspicious osseous finding. IMPRESSION: No active cardiopulmonary disease. No evidence of pneumonia or pulmonary edema. Cardiomediastinal silhouette appears stable. Electronically Signed   By: Bary Richard M.D.   On: 03/07/2018 16:03   US Renal  Result Date: 03/08/2018 CLINICAL DATA:  Oliguria. EXAM: RENAL / URINARY TRACT ULTRASOUND COMPLETE COMPARISON:  Abdominal CT 11/07/2008 and abdominal ultrasound 06/24/2006 FINDINGS: Right Kidney: Length: 11.2 cm. Echogenicity within normal limits. No mass or hydronephrosis visualized. Left Kidney: Length: 10.8 cm. Echogenicity within normal limits. No mass or hydronephrosis visualized. Bladder:  Bladder is decompressed and poorly visualized. IMPRESSION: Normal renal ultrasound. Electronically Signed   By: Richarda Overlie M.D.   On: 03/08/2018 13:35    EKG:   Orders placed or performed during the hospital encounter of 03/07/18  . ED EKG  . ED EKG  . EKG 12-Lead  . EKG 12-Lead      Management plans discussed with the patient, family and they are in agreement.  CODE STATUS:     Code Status Orders  (From admission, onward)         Start     Ordered   03/07/18 2017  Full code  Continuous     03/07/18 2017        Code Status History    This patient has a current code status but no historical code status.       TOTAL TIME TAKING CARE OF THIS PATIENT: 40 minutes.    Evelena Asa Alenna Russell M.D on 03/09/2018 at 9:31 AM  Between 7am to 6pm - Pager - [phone]  After 6pm go to [email or web address] - password [email or web address]  [facility]  Office  [phone]  CC: Primary care physician; Patient, No Pcp Per   Note: This dictation was prepared with Dragon dictation along with smaller phrase technology. Any transcriptional errors that result from this process are unintentional.

## 2018-03-09 NOTE — Care Management (Signed)
Patient admitted from home with acute dysphagia.  Patient lives at home with spouse and children.  Patient is employed, however she does not have health insurance.  Patient to discharge with scripts   for phenol(CHLORASEPTIC)   predniSONE(STERAPRED UNI-PAK 21 TAB)  Chloraseptic spray over the counter.  Patient was discharged with remaining Sterapred pak from hospital to complete course. Patient states that she has applied for charity Care through [facility] and has a follow up appointment with internal medicine 10/23.  RNCM signing off.

## 2018-03-10 LAB — HIV ANTIBODY (ROUTINE TESTING W REFLEX): HIV SCREEN 4TH GENERATION: NONREACTIVE

## 2018-03-12 ENCOUNTER — Emergency Department
Admission: EM | Admit: 2018-03-12 | Discharge: 2018-03-12 | Disposition: A | Discharge status: Home / Self Care | Payer: Self-pay | Attending: Emergency Medicine | Admitting: Emergency Medicine

## 2018-03-12 ENCOUNTER — Encounter: Payer: Self-pay | Admitting: Emergency Medicine

## 2018-03-12 ENCOUNTER — Other Ambulatory Visit: Payer: Self-pay

## 2018-03-12 ENCOUNTER — Emergency Department: Payer: Self-pay

## 2018-03-12 DIAGNOSIS — R140 Abdominal distension (gaseous): Secondary | ICD-10-CM | POA: Insufficient documentation

## 2018-03-12 DIAGNOSIS — J45909 Unspecified asthma, uncomplicated: Secondary | ICD-10-CM | POA: Insufficient documentation

## 2018-03-12 DIAGNOSIS — G8918 Other acute postprocedural pain: Secondary | ICD-10-CM | POA: Insufficient documentation

## 2018-03-12 DIAGNOSIS — R1084 Generalized abdominal pain: Secondary | ICD-10-CM

## 2018-03-12 DIAGNOSIS — F17210 Nicotine dependence, cigarettes, uncomplicated: Secondary | ICD-10-CM | POA: Insufficient documentation

## 2018-03-12 DIAGNOSIS — Z79899 Other long term (current) drug therapy: Secondary | ICD-10-CM | POA: Insufficient documentation

## 2018-03-12 LAB — BASIC METABOLIC PANEL
Anion gap: 6 (ref 5–15)
BUN: 18 mg/dL (ref 6–20)
CO2: 24 mmol/L (ref 22–32)
CREATININE: 0.93 mg/dL (ref 0.44–1.00)
Calcium: 9.3 mg/dL (ref 8.9–10.3)
Chloride: 107 mmol/L (ref 98–111)
GFR calc Af Amer: >60 mL/min (ref >60)
GLUCOSE: 126 mg/dL — AB (ref 70–99)
Potassium: 3.6 mmol/L (ref 3.5–5.1)
Sodium: 137 mmol/L (ref 135–145)

## 2018-03-12 LAB — CBC
HCT: 44.2 % (ref 36.0–46.0)
HEMOGLOBIN: 14.9 g/dL (ref 12.0–15.0)
MCH: 30 pg (ref 26.0–34.0)
MCHC: 33.7 g/dL (ref 30.0–36.0)
MCV: 88.9 fL (ref 80.0–100.0)
Platelets: 242 10*3/uL (ref 150–400)
RBC: 4.97 MIL/uL (ref 3.87–5.11)
RDW: 13.3 % (ref 11.5–15.5)
WBC: 8.4 10*3/uL (ref 4.0–10.5)
nRBC: 0 % (ref 0.0–0.2)

## 2018-03-12 LAB — TROPONIN I

## 2018-03-12 MED ORDER — FENTANYL CITRATE (PF) 100 MCG/2ML IJ SOLN
50.0000 ug | INTRAMUSCULAR | Status: DC | PRN
  Administered 2018-03-12: 50 ug via INTRAVENOUS
  Filled 2018-03-12: qty 2

## 2018-03-12 MED ORDER — GI COCKTAIL ~~LOC~~
30.0000 mL | Freq: Once | ORAL | Status: AC
  Administered 2018-03-12: 30 mL via ORAL
  Filled 2018-03-12: qty 30

## 2018-03-12 MED ORDER — METOCLOPRAMIDE HCL 5 MG/ML IJ SOLN
10.0000 mg | Freq: Once | INTRAMUSCULAR | Status: AC
  Administered 2018-03-12: 10 mg via INTRAVENOUS
  Filled 2018-03-12: qty 2

## 2018-03-12 MED ORDER — POLYETHYLENE GLYCOL 3350 17 GM/SCOOP PO POWD: 17.0000 g | Freq: Every day | ORAL | 0 refills | Status: DC | PRN

## 2018-03-12 MED ORDER — METOCLOPRAMIDE HCL 10 MG PO TABS: 10.0000 mg | ORAL_TABLET | Freq: Three times a day (TID) | ORAL | 0 refills | Status: DC

## 2018-03-12 MED ORDER — SODIUM CHLORIDE 0.9 % IV BOLUS
1000.0000 mL | Freq: Once | INTRAVENOUS | Status: AC
  Administered 2018-03-12: 1000 mL via INTRAVENOUS

## 2018-03-12 MED ORDER — ONDANSETRON HCL 4 MG/2ML IJ SOLN
4.0000 mg | Freq: Once | INTRAMUSCULAR | Status: AC
  Administered 2018-03-12: 4 mg via INTRAVENOUS
  Filled 2018-03-12: qty 2

## 2018-03-12 NOTE — ED Provider Notes (Signed)
[facility] Emergency Department Provider Note  Time seen: 11:28 AM  I have reviewed the triage vital signs and the nursing notes.   HISTORY  Chief Complaint Post-op Problem    HPI Alexis Gilbert is a 47 y.o. female with a past medical history of asthma who presents to the emergency department for nausea, decreased appetite, bloating.  According to the patient last week she had a partial hysterectomy performed at [facility].  Patient states on Friday she presented back to the emergency department here unable to tolerate anything orally, feels very dehydrated felt very bloated and full was admitted to the hospital and discharged on Monday.  Patient states since gone home on Monday she is continued to feel nauseated has not been able to eat or drink much, feels a bloated fullness sensation to her abdomen and up into her chest.  Patient states it feels exactly like it did after her surgery.  States she has been very constipated as well has only had one bowel movement that was very small and hard.  Denies any fever.   Past Medical History:  Diagnosis Date  . Asthma     Patient Active Problem List   Diagnosis Date Noted  . Dysphagia 03/07/2018    Past Surgical History:  Procedure Laterality Date  . ABDOMINAL HYSTERECTOMY    . hemmorhoidectomy    . HERNIA REPAIR    . TUBAL LIGATION      Prior to Admission medications   Medication Sig Start Date End Date Taking? Authorizing Provider  albuterol (PROVENTIL HFA;VENTOLIN HFA) 108 (90 Base) MCG/ACT inhaler Inhale 2 puffs into the lungs every 6 (six) hours as needed for wheezing or shortness of breath. Patient not taking: Reported on 03/07/2018 07/17/17   Nita Sickle, MD  escitalopram (LEXAPRO) 20 MG tablet Take 1 tablet by mouth daily. 12/29/15   [provider]  levonorgestrel (MIRENA) 20 MCG/24HR IUD 1 each once by Intrauterine route.    [provider]  phenol (CHLORASEPTIC) 1.4 % LIQD Use as  directed 1 spray in the mouth or throat as needed for throat irritation / pain. 03/09/18   Salary, Evelena Asa, MD  predniSONE (STERAPRED UNI-PAK 21 TAB) 10 MG (21) TBPK tablet As directed 03/09/18   Salary, Evelena Asa, MD    Allergies  Allergen Reactions  . Codeine Anaphylaxis  . Vicodin [Hydrocodone-Acetaminophen] Rash    No family history on file.  Social History Social History   Tobacco Use  . Smoking status: Current Every Day Smoker    Packs/day: 0.30    Types: Cigarettes  . Smokeless tobacco: Never Used  Substance Use Topics  . Alcohol use: No    Frequency: Never  . Drug use: Not Currently    Review of Systems Constitutional: Negative for fever Cardiovascular: Negative for chest pain. Respiratory: Negative for shortness of breath. Gastrointestinal: Positive for abdominal fullness/bloating.  Positive for nausea times.  Negative for vomiting.  Negative diarrhea.  Positive for constipation. Genitourinary: Negative for urinary compaints Musculoskeletal: Negative for musculoskeletal complaints Skin: Negative for skin complaints  Neurological: Negative for headache All other ROS negative  ____________________________________________   PHYSICAL EXAM:  VITAL SIGNS: ED Triage Vitals  Enc Vitals Group     BP 03/12/18 1012 116/71     Pulse Rate 03/12/18 1012 86     Resp 03/12/18 1012 18     Temp 03/12/18 1012 97.7 F (36.5 C)     Temp Source 03/12/18 1012 Oral  SpO2 03/12/18 1012 98 %     Weight 03/12/18 1011 200 lb 3.2 oz (90.8 kg)     Height 03/12/18 1011 5\' 10"  (1.778 m)     Head Circumference --      Peak Flow --      Pain Score 03/12/18 1127 0     Pain Loc --      Pain Edu? --      Excl. in GC? --    Constitutional: Alert and oriented. Well appearing and in no distress. Eyes: Normal exam ENT   Head: Normocephalic and atraumatic.   Mouth/Throat: Mucous membranes are moist. Cardiovascular: Normal rate, regular rhythm. No murmur Respiratory:  Normal respiratory effort without tachypnea nor retractions. Breath sounds are clear  Gastrointestinal: Soft, mild diffuse tenderness, no rebound guarding or distention.  No focal area of tenderness identified. Musculoskeletal: Nontender with normal range of motion in all extremities. Neurologic:  Normal speech and language. No gross focal neurologic deficits  Skin:  Skin is warm, dry and intact.  Psychiatric: Mood and affect are normal. S  ____________________________________________   EKG reviewed and interpreted by myself shows a normal sinus rhythm 80 bpm with a narrow QRS, normal axis, normal intervals, no concerning ST changes.  INITIAL IMPRESSION / ASSESSMENT AND PLAN / ED COURSE  Pertinent labs & imaging results that were available during my care of the patient were reviewed by me and considered in my medical decision making (see chart for details).  Patient presents emergency department for abdominal fullness, nausea, bloating ever since her surgery was performed Thursday at [facility].  Overall abdomen has mild diffuse tenderness but no significant tenderness and no focal tenderness.  Patient states no worsening since her surgery.  Her main complaint is feeling bloated and full and being very constipated.  Has been using Colace without success.  Incisions on the abdomen are well-appearing.  Patient's lab work is reassuring with a normal white blood cell count, normal chemistry including renal function and a negative troponin, negative chest x-ray.  Patient's symptoms are possibly due to postoperative ileus.  We will dose a GI cocktail, IV Reglan and IV fluids.  As the patient appears very well anticipate likely discharge home with continued Reglan and MiraLAX to promote intestinal motility.  Patient agreeable to plan of care.  Patient states she is feeling much better after Reglan and fluids.  We will discharge on Reglan, MiraLAX and have the patient follow-up with a PCP.  Patient  agreeable to plan of care. ____________________________________________   FINAL CLINICAL IMPRESSION(S) / ED DIAGNOSES  Bloating Abdominal fullness    Minna Antis, MD 03/12/18 1253

## 2018-03-12 NOTE — ED Triage Notes (Signed)
Pt had partial hysterectomy last Thursday and reports complications since. PT was seen and admitted Saturday due to inability to eat/drink. Pt reports no change since discharge on Monday. Pt also reports pain from abdomen to throat.

## 2018-06-03 ENCOUNTER — Ambulatory Visit: Payer: Self-pay | Admitting: Physician Assistant

## 2018-06-03 VITALS — BP 124/82 | HR 96 | Temp 97.9°F | Resp 18 | Wt 192.0 lb

## 2018-06-03 DIAGNOSIS — J101 Influenza due to other identified influenza virus with other respiratory manifestations: Secondary | ICD-10-CM

## 2018-06-03 DIAGNOSIS — R6889 Other general symptoms and signs: Secondary | ICD-10-CM

## 2018-06-03 LAB — POCT INFLUENZA A/B
INFLUENZA A, POC: POSITIVE — AB
INFLUENZA B, POC: NEGATIVE

## 2018-06-03 MED ORDER — FLUTICASONE PROPIONATE 50 MCG/ACT NA SUSP: 2.0000 | Freq: Every day | NASAL | 0 refills | Status: DC

## 2018-06-03 MED ORDER — LORATADINE-PSEUDOEPHEDRINE ER 5-120 MG PO TB12: 1.0000 | ORAL_TABLET | Freq: Two times a day (BID) | ORAL | 0 refills | Status: AC

## 2018-06-03 NOTE — Progress Notes (Signed)
Patient ID: Alexis Dawsonmanda G Colmenares DOB: 1970-07-17 AGE: 48 y.o. MRN: [id number]   PCP: Patient, No Pcp Per   Chief Complaint:  Chief Complaint  Patient presents with  . Save- ear pain,sore throat,drainage,headache,coughx3     Subjective:    HPI:  Alexis Gilbert is a 48 y.o. female presents for evaluation  Chief Complaint  Patient presents with  . Save- ear pain,sore throat,drainage,headache,coughx693    48 year old female presents to [facility] with five day history of URI symptoms. Began with malaise and body aches. Suspected low-grade fever. Felt better the following two days, then symptoms worsened on Monday 06/01/2018. Developed sneezing, rhinorrhea, postnasal drip, bilateral maxillary and frontal sinus pressure/congestion, right ear pain, chest congestion, and cough. Cough junky sounding. Not productive. Has taken OTC Tylenol (last dose yesterday evening). Has not taken any additional OTC medication. Denies return of fever, chills, headache, ear discharge/drainage, tinnitus, sore throat, chest pain, SOB, wheezing. Patient with asthma. Has albuterol inhaler. Has not felt the need to use her inhaler. Patient is a current cigarette smoker. Patient reports multiple patients recently with influenza. Patient did receive this season's flu vaccination.  [facility] on 07/15/2017 with 2 day history of flu-like symptoms. Reported ear pain/pressure, body ahces, nasal congestion, and productive cough. Negative rapid flu test. Prescribed Tamiflu anyways. Patient sent e-message following day reporting worsening symptoms. Requested Z-pak for sinus infection. Advised to f/u. [facility] ED [facility]17/[facility]. Extensive workup including CBC, BMP, CXR, and UA. Patient given IV fluids. Was discharged with suspected URI. Prescribed albuterol inhaler and Afrin nasal spray. Patient sent e-message on 07/20/2017 stating she was still not getting any better. Advised to make f/u  appt. No additional appt seen in system.  Patient also [facility] at [facility] 05/24/2017 with one day history of flu-like symptoms. Negative rapid flu test. Diagnosed with viral URI. Advised to use OTC symptomatic treatment modalities.  A limited review of symptoms was performed, pertinent positives and negatives as mentioned in HPI.  The following portions of the patient's history were reviewed and updated as appropriate: allergies, current medications and past medical history.  Patient Active Problem List   Diagnosis Date Noted  . Dysphagia 03/07/2018    Allergies  Allergen Reactions  . Codeine Anaphylaxis  . Vicodin [Hydrocodone-Acetaminophen] Rash    Current Outpatient Medications on File Prior to Visit  Medication Sig Dispense Refill  . albuterol (PROVENTIL HFA;VENTOLIN HFA) 108 (90 Base) MCG/ACT inhaler Inhale 2 puffs into the lungs every 6 (six) hours as needed for wheezing or shortness of breath. (Patient not taking: Reported on 03/07/2018) 1 Inhaler 2  . escitalopram (LEXAPRO) 20 MG tablet Take 1 tablet by mouth daily.    Marland Kitchen. levonorgestrel (MIRENA) 20 MCG/24HR IUD 1 each once by Intrauterine route.     No current facility-administered medications on file prior to visit.        Objective:   Vitals:   06/03/18 1204  BP: 124/82  Pulse: 96  Resp: 18  Temp: 97.9 F (36.6 C)  SpO2: 97%     Wt Readings from Last 3 Encounters:  06/03/18 192 lb (87.1 kg)  03/12/18 200 lb 3.2 oz (90.8 kg)  03/09/18 200 lb 3.2 oz (90.8 kg)    Physical Exam:   General Appearance:  Patient sitting comfortably on examination table. Conversational. Peri JeffersonGood self-historian. In no acute distress. Afebrile.   Head:  Normocephalic, without obvious abnormality, atraumatic  Eyes:  PERRL, conjunctiva/corneas clear, EOM's intact  Ears:  Bilateral ear canals WNL. No erythema or edema. No discharge/drainage. Bilateral TMs WNL. No erythema. No injection. Right TM with mild serous effusion.  Nose: Nares  normal, septum midline. Nasal mucosa with bilateral edema. Scant clear rhinorrhea. Mild tenderness with palpation over bilateral maxillary sinuses.  Throat: Lips, mucosa, and tongue normal; teeth and gums normal. Throat reveals no erythema. Tonsils with no enlargement or exudate.  Neck: Supple, symmetrical, trachea midline, no lymphadenopathy  Lungs:   Clear to auscultation bilaterally, respirations unlabored. Good aeration. No rales, rhonchi, crackles or wheezing. No cough elicited with deep inspiration or forced expiration.  Heart:  Regular rate and rhythm, S1 and S2 normal, no murmur, rub, or gallop  Extremities: Extremities normal, atraumatic, no cyanosis or edema  Pulses: 2+ and symmetric  Skin: Skin color, texture, turgor normal, no rashes or lesions  Lymph nodes: Cervical, supraclavicular, and axillary nodes normal  Neurologic: Normal    Assessment & Plan:    Exam findings, diagnosis etiology and medication use and indications reviewed with patient. Follow-Up and discharge instructions provided. No emergent/urgent issues found on exam.  Patient education was provided.   Patient verbalized understanding of information provided and agrees with plan of care (POC), all questions answered. The patient is advised to call or return to clinic if condition does not see an improvement in symptoms, or to seek the care of the closest emergency department if condition worsens with the below plan.    1. Influenza A  - loratadine-pseudoephedrine (CLARITIN-D 12 HOUR) 5-120 MG tablet; Take 1 tablet by mouth 2 (two) times daily for 7 days.  Dispense: 14 tablet; Refill: 0 - fluticasone (FLONASE) 50 MCG/ACT nasal spray; Place 2 sprays into both nostrils daily.  Dispense: 16 g; Refill: 0  2. Flu-like symptoms  - POCT Influenza A/B  Patient with five day history of flu-like/URI symptoms. Positive rapid flu test, for strain A, performed in clinic today. However, patient well out of the Tamiflu  effectiveness range. Patient's symptoms consistent with continued URI symptoms; at this time do not believe patient has bacterial sinusitis or AOM (afebrile, no purulent drainage, no erythematous TM) or bronchitis/bronchospasm (no wheezing on exam, no cough elicited with deep inspiration). Prescribed Claritin-D and Flonase for right ETD/serous effusion. Advised OTC Delsym for cough. Gave patient work excuse note. Discussed contagiousness. Advised patient return to clinic in one week if symptoms not improving, sooner with any worsening symptoms.   Janalyn Harder, MHS, PA-C Rulon Sera, MHS, PA-C Advanced Practice Provider [facility]  [address] [facility], [address]st [address] [place], [place] [address] (p):  [phone] [email or web address]

## 2018-06-03 NOTE — Patient Instructions (Signed)
Thank you for choosing [facility] for your health care needs.  You have been diagnosed with influenza A (positive rapid flu test).  Recommend increase fluids. Rest. May use over the counter Delsym for cough.  Follow-up with family physician, urgent care, or [facility] if symptoms not improving in one week.  Influenza, Adult Influenza is also called "the flu." It is an infection in the lungs, nose, and throat (respiratory tract). It is caused by a virus. The flu causes symptoms that are similar to symptoms of a cold. It also causes a high fever and body aches. The flu spreads easily from person to person (is contagious). Getting a flu shot (influenza vaccination) every year is the best way to prevent the flu. What are the causes? This condition is caused by the influenza virus. You can get the virus by:  Breathing in droplets that are in the air from the cough or sneeze of a person who has the virus.  Touching something that has the virus on it (is contaminated) and then touching your mouth, nose, or eyes. What increases the risk? Certain things may make you more likely to get the flu. These include:  Not washing your hands often.  Having close contact with many people during cold and flu season.  Touching your mouth, eyes, or nose without first washing your hands.  Not getting a flu shot every year. You may have a higher risk for the flu, along with serious problems such as a lung infection (pneumonia), if you:  Are older than 65.  Are pregnant.  Have a weakened disease-fighting system (immune system) because of a disease or taking certain medicines.  Have a long-term (chronic) illness, such as: ? Heart, kidney, or lung disease. ? Diabetes. ? Asthma.  Have a liver disorder.  Are very overweight (morbidly obese).  Have anemia. This is a condition that affects your red blood cells. What are the signs or symptoms? Symptoms usually begin suddenly and last 4-14 days. They may  include:  Fever and chills.  Headaches, body aches, or muscle aches.  Sore throat.  Cough.  Runny or stuffy (congested) nose.  Chest discomfort.  Not wanting to eat as much as normal (poor appetite).  Weakness or feeling tired (fatigue).  Dizziness.  Feeling sick to your stomach (nauseous) or throwing up (vomiting). How is this treated? If the flu is found early, you can be treated with medicine that can help reduce how bad the illness is and how long it lasts (antiviral medicine). This may be given by mouth (orally) or through an IV tube. Taking care of yourself at home can help your symptoms get better. Your doctor may suggest:  Taking over-the-counter medicines.  Drinking plenty of fluids. The flu often goes away on its own. If you have very bad symptoms or other problems, you may be treated in a hospital. Follow these instructions at home:     Activity  Rest as needed. Get plenty of sleep.  Stay home from work or school as told by your doctor. ? Do not leave home until you do not have a fever for 24 hours without taking medicine. ? Leave home only to visit your doctor. Eating and drinking  Take an ORS (oral rehydration solution). This is a drink that is sold at pharmacies and stores.  Drink enough fluid to keep your pee (urine) pale yellow.  Drink clear fluids in small amounts as you are able. Clear fluids include: ? Water. ? Ice chips. ? Fruit  juice that has water added (diluted fruit juice). ? Low-calorie sports drinks.  Eat bland, easy-to-digest foods in small amounts as you are able. These foods include: ? Bananas. ? Applesauce. ? Rice. ? Lean meats. ? Toast. ? Crackers.  Do not eat or drink: ? Fluids that have a lot of sugar or caffeine. ? Alcohol. ? Spicy or fatty foods. General instructions  Take over-the-counter and prescription medicines only as told by your doctor.  Use a cool mist humidifier to add moisture to the air in your home.  This can make it easier for you to breathe.  Cover your mouth and nose when you cough or sneeze.  Wash your hands with soap and water often, especially after you cough or sneeze. If you cannot use soap and water, use alcohol-based hand sanitizer.  Keep all follow-up visits as told by your doctor. This is important. How is this prevented?   Get a flu shot every year. You may get the flu shot in late summer, fall, or winter. Ask your doctor when you should get your flu shot.  Avoid contact with people who are sick during fall and winter (cold and flu season). Contact a doctor if:  You get new symptoms.  You have: ? Chest pain. ? Watery poop (diarrhea). ? A fever.  Your cough gets worse.  You start to have more mucus.  You feel sick to your stomach.  You throw up. Get help right away if you:  Have shortness of breath.  Have trouble breathing.  Have skin or nails that turn a bluish color.  Have very bad pain or stiffness in your neck.  Get a sudden headache.  Get sudden pain in your face or ear.  Cannot eat or drink without throwing up. Summary  Influenza ("the flu") is an infection in the lungs, nose, and throat. It is caused by a virus.  Take over-the-counter and prescription medicines only as told by your doctor.  Getting a flu shot every year is the best way to avoid getting the flu. This information is not intended to replace advice given to you by your health care provider. Make sure you discuss any questions you have with your health care provider. Document Released: 02/20/2008 Document Revised: 10/29/2017 Document Reviewed: 10/29/2017 Elsevier Interactive Patient Education  2019 [employer].

## 2018-06-09 DIAGNOSIS — H5213 Myopia, bilateral: Secondary | ICD-10-CM | POA: Diagnosis not present

## 2018-06-09 DIAGNOSIS — H52229 Regular astigmatism, unspecified eye: Secondary | ICD-10-CM | POA: Diagnosis not present

## 2018-06-10 ENCOUNTER — Telehealth: Payer: Self-pay | Admitting: Emergency Medicine

## 2018-06-10 NOTE — Telephone Encounter (Signed)
Left message follow up call from [facility] visit 

## 2018-07-17 DIAGNOSIS — N898 Other specified noninflammatory disorders of vagina: Secondary | ICD-10-CM | POA: Diagnosis not present

## 2018-07-17 DIAGNOSIS — Z7721 Contact with and (suspected) exposure to potentially hazardous body fluids: Secondary | ICD-10-CM | POA: Diagnosis not present

## 2018-09-28 DIAGNOSIS — R6889 Other general symptoms and signs: Secondary | ICD-10-CM | POA: Diagnosis not present

## 2018-09-28 DIAGNOSIS — R5383 Other fatigue: Secondary | ICD-10-CM | POA: Diagnosis not present

## 2018-09-28 DIAGNOSIS — R51 Headache: Secondary | ICD-10-CM | POA: Diagnosis not present

## 2018-09-28 DIAGNOSIS — R509 Fever, unspecified: Secondary | ICD-10-CM | POA: Diagnosis not present

## 2018-09-29 DIAGNOSIS — L918 Other hypertrophic disorders of the skin: Secondary | ICD-10-CM | POA: Diagnosis not present

## 2018-09-29 DIAGNOSIS — Z113 Encounter for screening for infections with a predominantly sexual mode of transmission: Secondary | ICD-10-CM | POA: Diagnosis not present

## 2018-09-29 DIAGNOSIS — N951 Menopausal and female climacteric states: Secondary | ICD-10-CM | POA: Diagnosis not present

## 2018-09-29 LAB — HM HEPATITIS C SCREENING LAB: HM Hepatitis Screen: NEGATIVE

## 2019-03-02 ENCOUNTER — Ambulatory Visit (INDEPENDENT_AMBULATORY_CARE_PROVIDER_SITE_OTHER): Payer: 59

## 2019-03-02 ENCOUNTER — Encounter: Payer: Self-pay | Admitting: Emergency Medicine

## 2019-03-02 ENCOUNTER — Ambulatory Visit
Admission: EM | Admit: 2019-03-02 | Discharge: 2019-03-02 | Disposition: A | Discharge status: Home / Self Care | Payer: 59

## 2019-03-02 ENCOUNTER — Other Ambulatory Visit: Payer: Self-pay

## 2019-03-02 DIAGNOSIS — J329 Chronic sinusitis, unspecified: Secondary | ICD-10-CM

## 2019-03-02 DIAGNOSIS — B9689 Other specified bacterial agents as the cause of diseases classified elsewhere: Secondary | ICD-10-CM

## 2019-03-02 DIAGNOSIS — R05 Cough: Secondary | ICD-10-CM

## 2019-03-02 DIAGNOSIS — R059 Cough, unspecified: Secondary | ICD-10-CM

## 2019-03-02 MED ORDER — ALBUTEROL SULFATE HFA 108 (90 BASE) MCG/ACT IN AERS: 2.0000 | INHALATION_SPRAY | Freq: Four times a day (QID) | RESPIRATORY_TRACT | 0 refills | Status: DC | PRN

## 2019-03-02 MED ORDER — PSEUDOEPH-BROMPHEN-DM 30-2-10 MG/5ML PO SYRP: 5.0000 mL | ORAL_SOLUTION | Freq: Four times a day (QID) | ORAL | 0 refills | Status: DC | PRN

## 2019-03-02 MED ORDER — DOXYCYCLINE HYCLATE 100 MG PO CAPS: 100.0000 mg | ORAL_CAPSULE | Freq: Two times a day (BID) | ORAL | 0 refills | Status: DC

## 2019-03-02 NOTE — ED Provider Notes (Signed)
[place], [place]   Name: Alexis Gilbert DOB: 11-12-1970 MRN: [id number] CSN: [id number] PCP: Patient, No Pcp Per  Arrival date and time:  03/02/19 1411  Chief Complaint:  Cough, Wheezing, and Nasal Congestion   NOTE: Prior to seeing the patient today, I have reviewed the triage nursing documentation and vital signs. Clinical staff has updated patient's PMH/PSHx, current medication list, and drug allergies/intolerances to ensure comprehensive history available to assist in medical decision making.   History:   HPI: Alexis Gilbert is a 48 y.o. female who presents today with complaints of cough and congestion for the past 4 months. Patient notes significant post nasal drainage. She has a PMH that is (+) for seasonal allergies, thus patient has not sought care citing that she "just thought that it was her allergies". Patient denies any associated fevers. She has not experienced any nausea, vomiting, diarrhea, or abdominal pain. She is eating and drinking well. Patient has not appreciated any changes to her ability to taste or smell. 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 She advises that they are regularly tested for SARS-CoV-2 (novel coronavirus). Despite her symptoms, patient has not taken any over the counter interventions to help improve/relieve her reported symptoms at home. P  Past Medical History:  Diagnosis Date  . Asthma     Past Surgical History:  Procedure Laterality Date  . ABDOMINAL HYSTERECTOMY    . hemmorhoidectomy    . HERNIA REPAIR    . TUBAL LIGATION      History reviewed. No pertinent family history.  Social History   Tobacco Use  . Smoking status: Current Every Day Smoker    Packs/day: 0.30    Types: Cigarettes  . Smokeless tobacco: Never Used  Substance Use Topics  . Alcohol use: No    Frequency: Never  . Drug use: Not Currently    Patient Active Problem List   Diagnosis Date Noted  . Dysphagia 03/07/2018    Home Medications:     Current Meds  Medication Sig  . buPROPion (WELLBUTRIN XL) 150 MG 24 hr tablet Take 150 mg by mouth daily.    Allergies:   Codeine and Vicodin [hydrocodone-acetaminophen]  Review of Systems (ROS): Review of Systems  Constitutional: Negative for fatigue and fever.  HENT: Positive for congestion, sinus pressure and voice change. Negative for ear pain, postnasal drip, rhinorrhea, sinus pain, sneezing and sore throat.   Eyes: Negative for pain, discharge and redness.  Respiratory: Positive for cough and shortness of breath. Negative for chest tightness.   Cardiovascular: Negative for chest pain and palpitations.  Gastrointestinal: Negative for abdominal pain, diarrhea, nausea and vomiting.  Musculoskeletal: Negative for arthralgias, back pain, myalgias and neck pain.  Skin: Negative for color change, pallor and rash.  Allergic/Immunologic: Positive for environmental allergies (seasonal).  Neurological: Negative for dizziness, syncope, weakness and headaches.  Hematological: Negative for adenopathy.     Vital Signs: Today's Vitals   03/02/19 1422 03/02/19 1424 03/02/19 1535  BP:  121/77   Pulse:  84   Resp:  18   Temp:  98 F (36.7 C)   TempSrc:  Oral   SpO2:  98%   Weight: 190 lb (86.2 kg)    Height: 5\' 7"  (1.702 m)    PainSc: 7   7     Physical Exam: Physical Exam  Constitutional: She is oriented to person, place, and time and well-developed, well-nourished, and in no distress. No distress.  HENT:  Head: Normocephalic  and atraumatic.  Nose: Nose normal.  Mouth/Throat: Oropharynx is clear and moist.  Hoarse voice quality  Eyes: Pupils are equal, round, and reactive to light. Conjunctivae and EOM are normal.  Neck: Normal range of motion. Neck supple. No tracheal deviation present.  Cardiovascular: Normal rate, regular rhythm, normal heart sounds and intact distal pulses. Exam reveals no gallop and no friction rub.  No murmur heard. Pulmonary/Chest: Effort normal. No  accessory muscle usage. No respiratory distress. She has no decreased breath sounds. She has wheezes (scattered expiratory). She has rhonchi (diffusely scattered; clear somewhat with cough). She has no rales.  Abdominal: Soft. Bowel sounds are normal. She exhibits no distension. There is no abdominal tenderness.  Musculoskeletal: Normal range of motion.  Neurological: She is alert and oriented to person, place, and time. Gait normal.  Skin: Skin is warm and dry. No rash noted. She is not diaphoretic.  Psychiatric: Mood, memory, affect and judgment normal.  Nursing note and vitals reviewed.   Urgent Care Treatments / Results:   LABS: PLEASE NOTE: all labs that were ordered this encounter are listed, however only abnormal results are displayed. Labs Reviewed - No data to display  EKG: -None  RADIOLOGY: Dg Chest 2 View  Result Date: 03/02/2019 CLINICAL DATA:  Cough, wheezing and sinus drainage starting 4 months ago. EXAM: CHEST - 2 VIEW COMPARISON:  03/12/2018 FINDINGS: Normal heart, mediastinum and hila. Lungs are mildly hyperexpanded, but clear. No pleural effusion or pneumothorax. Skeletal structures are intact. IMPRESSION: No active cardiopulmonary disease. Electronically Signed   By: Lajean Manes M.D.   On: 03/02/2019 15:28    PROCEDURES: Procedures  MEDICATIONS RECEIVED THIS VISIT: Medications - No data to display  PERTINENT CLINICAL COURSE NOTES/UPDATES:   Initial Impression / Assessment and Plan / Urgent Care Course:  Pertinent labs & imaging results that were available during my care of the patient were personally reviewed by me and considered in my medical decision making (see lab/imaging section of note for values and interpretations).  Alexis Gilbert is a 48 y.o. female who presents to [facility] today with complaints of Cough, Wheezing, and Nasal Congestion   Patient is well appearing overall in clinic today. She does not appear to be in any acute distress.  Presenting symptoms (see HPI) and exam as documented above. Radiographs of the chest revealed no acute cardiopulmonary process; no evidence of peribronchial thickening, areas of consolidation, or focal infiltrates. Patient has sinus tenderness and a deep cough. Patient has had recent SARS-CoV-2 (novel coronavirus) testing that resulted as negative. Will treat with sinusitis with cough. Due to the chronicity of her symptoms, will cover for atypical pathogens using a 10 day course of doxycycline. Will send in an albuterol inhaler to help with the shortness of breath and wheezing. Discussed supportive care measures at home during acute phase of illness. Patient to rest as much as possible. She was encouraged to ensure adequate hydration (water and ORS) to prevent dehydration and electrolyte derangements. Patient may use APAP and/or IBU on an as needed basis for pain/fever. Will send in a supply of [place] for PRN use.   Discussed follow up with primary care physician in 1 week for re-evaluation. I have reviewed the follow up and strict return precautions for any new or worsening symptoms. Patient is aware of symptoms that would be deemed urgent/emergent, and would thus require further evaluation either here or in the emergency department. At the time of discharge, she verbalized understanding and consent with the  discharge plan as it was reviewed with her. All questions were fielded by provider and/or clinic staff prior to patient discharge.    Final Clinical Impressions / Urgent Care Diagnoses:   Final diagnoses:  Bacterial sinusitis  Cough    New Prescriptions:  [place] Controlled Substance Registry consulted? Not Applicable  Meds ordered this encounter  Medications  . doxycycline (VIBRAMYCIN) 100 MG capsule    Sig: Take 1 capsule (100 mg total) by mouth 2 (two) times daily.    Dispense:  20 capsule    Refill:  0  . DISCONTD: albuterol (VENTOLIN HFA) 108 (90 Base) MCG/ACT inhaler    Sig: Inhale 2 puffs  into the lungs every 6 (six) hours as needed for wheezing or shortness of breath.    Dispense:  18 g    Refill:  0  . brompheniramine-pseudoephedrine-DM 30-2-10 MG/5ML syrup    Sig: Take 5 mLs by mouth 4 (four) times daily as needed.    Dispense:  120 mL    Refill:  0  . albuterol (VENTOLIN HFA) 108 (90 Base) MCG/ACT inhaler    Sig: Inhale 2 puffs into the lungs every 6 (six) hours as needed for wheezing or shortness of breath.    Dispense:  18 g    Refill:  0    Recommended Follow up Care:  Patient encouraged to follow up with the following provider within the specified time frame, or sooner as dictated by the severity of her symptoms. As always, she was instructed that for any urgent/emergent care needs, she should seek care either here or in the emergency department for more immediate evaluation.  NOTE: This note was prepared using Scientist, clinical (histocompatibility and immunogenetics)Dragon dictation software along with smaller Lobbyistphrase technology. Despite my best ability to proofread, there is the potential that transcriptional errors may still occur from this process, and are completely unintentional.    Verlee MonteGray, Graylin Sperling E, NP 03/02/19 1551

## 2019-03-02 NOTE — Discharge Instructions (Addendum)
It was very nice seeing you today in clinic. Thank you for entrusting me with your care.   Rest and Increase fluid intake as much as possible. Water is always best, as sugar and caffeine containing fluids can cause you to become dehydrated. Try to incorporate electrolyte enriched fluids, such as Gatorade or Pedialyte, into your daily fluid intake.  Please utilize the medications that we discussed. Your prescriptions have been called in to your pharmacy.   Make arrangements to follow up with your regular doctor in 1 week for re-evaluation if not improving. If your symptoms/condition worsens, please seek follow up care either here or in the ER. Please remember, our [place] providers are "right here with you" when you need [place].   Again, it was my pleasure to take care of you today. Thank you for choosing our clinic. I hope that you start to feel better quickly.   Honor Loh, MSN, APRN, FNP-C, CEN Advanced Practice Provider [facility]

## 2019-03-02 NOTE — ED Triage Notes (Signed)
Patient c/o cough, wheezing and sinus drainage that started 4 months ago. 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 She states she has been tested for COVID multiple times and all have been negative.

## 2019-04-05 ENCOUNTER — Other Ambulatory Visit: Payer: Self-pay

## 2019-04-05 DIAGNOSIS — Z20822 Contact with and (suspected) exposure to covid-19: Secondary | ICD-10-CM

## 2019-04-07 ENCOUNTER — Telehealth: Payer: Self-pay | Admitting: *Deleted

## 2019-04-07 DIAGNOSIS — Z20822 Contact with and (suspected) exposure to covid-19: Secondary | ICD-10-CM

## 2019-04-07 NOTE — Addendum Note (Signed)
Addended by: Denman George on: 04/07/2019 10:27 AM   Modules accepted: Orders

## 2019-04-07 NOTE — Telephone Encounter (Signed)
Advised by Clinical Manager to contact pt. And confirm that she did have COVID test done on 11/9, and if confirmed, to place order for COVID 19 test.  Phone call to pt. Discussed COVID test.  Pt.

## 2019-04-07 NOTE — Telephone Encounter (Signed)
Continued from previous documentation: In speaking with pt. about COVID test, she confirmed that she did go to have COVID test on 04/05/19.  Informed patient that the wrong order had been placed.  Advised pt. that nurse will place correct order, so [facility] can evaluate her  specimen, and finalize results.  Pt. Verb. Understanding, and agreed with plan.

## 2019-04-07 NOTE — Telephone Encounter (Signed)
Alexis Gilbert called in from [facility].   This pt' order was entered as a MODY test instead of a COVID-19 test.   He is needing the order changed. I checked with my team leader Arbie Cookey Pullins.    This information will be passed on to Rhae Lerner to follow up on.  I let Alexis Gilbert know someone would be in contact with [facility] regarding this order.

## 2019-04-14 LAB — STATUS REPORT

## 2019-04-19 LAB — MATURITY ONSET DIABETES OF THE YOUNG(MODY)GENETIC PROFILE

## 2019-10-19 IMAGING — CR DG CHEST 2V
2 series · 2 of 2 positions shown · non-contrast
Comparison: Chest radiographs 07/17/2017, 08/30/2011.

CLINICAL DATA: 47-year-old female with chest pain onset this
morning. Smoker.

EXAM:
CHEST - 2 VIEW

[chest pa]
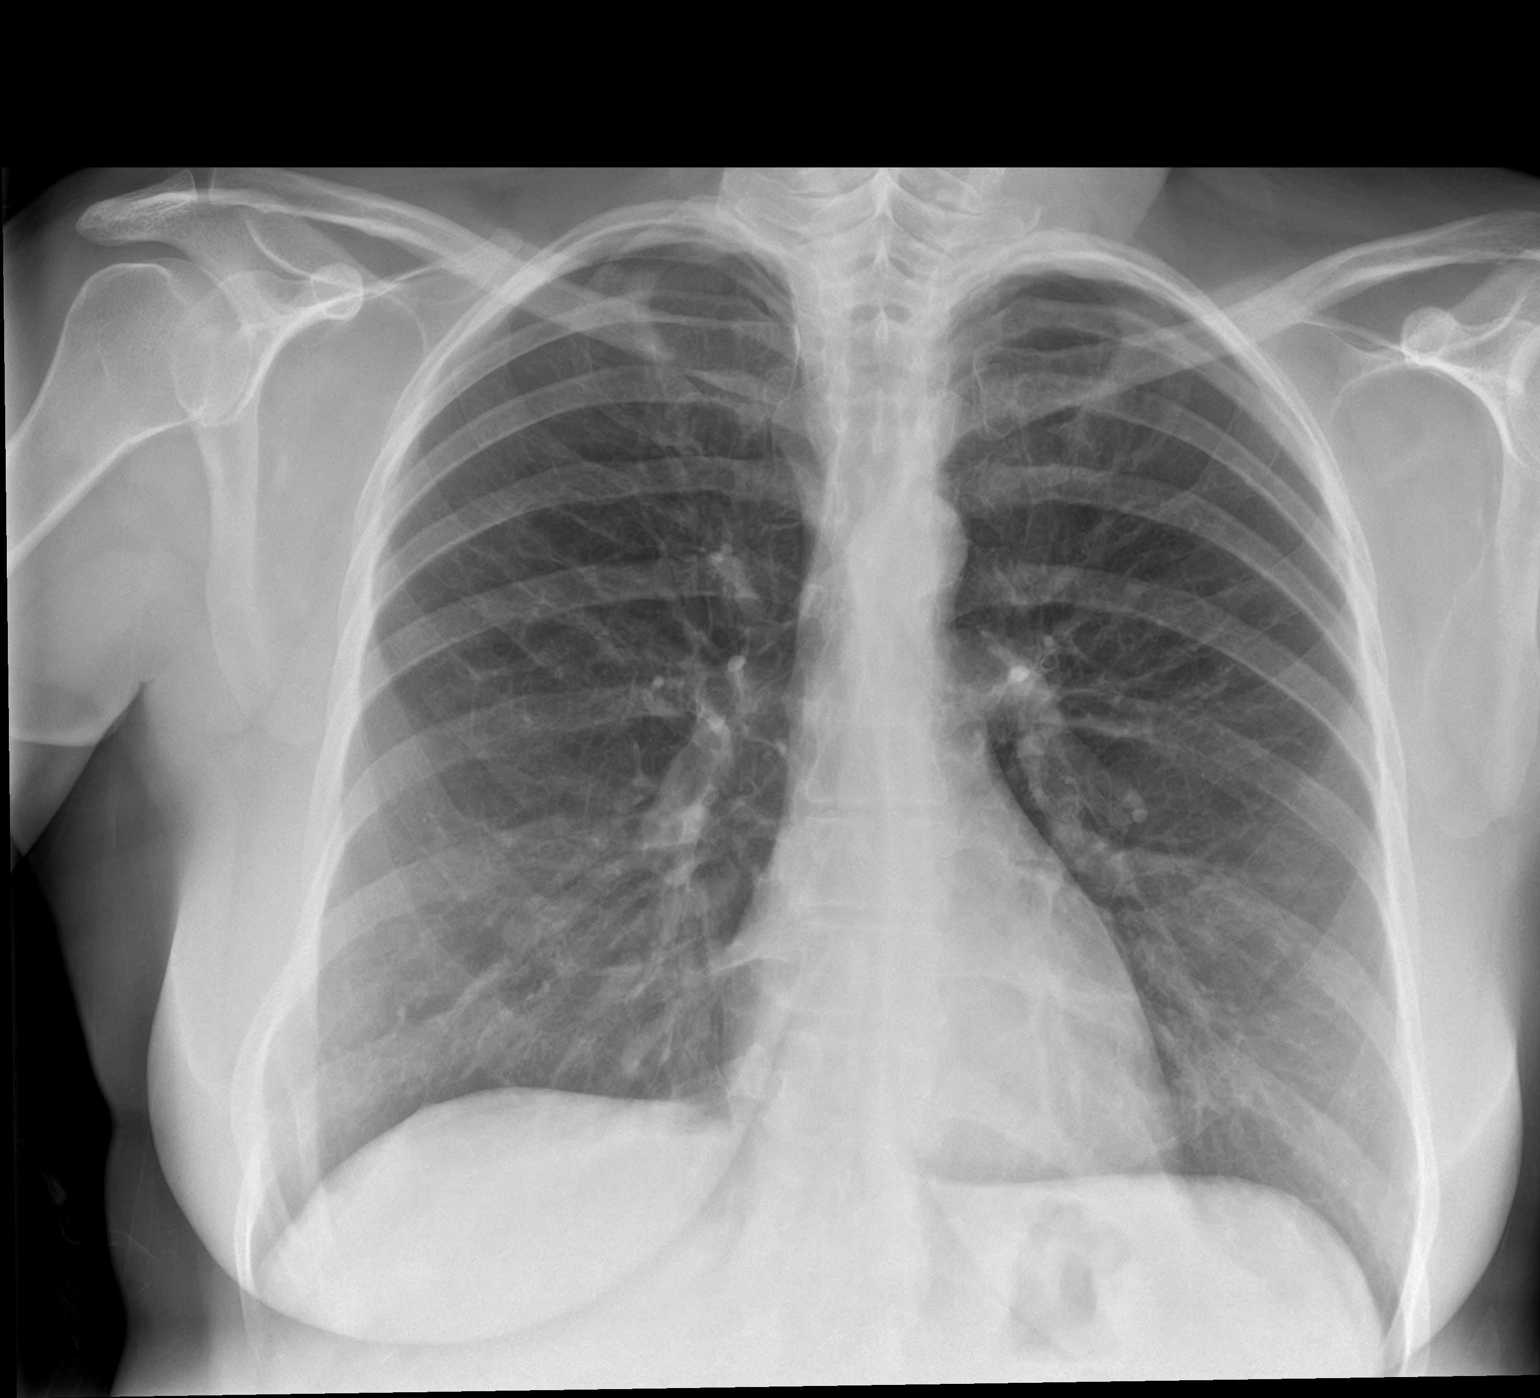

[chest lat]
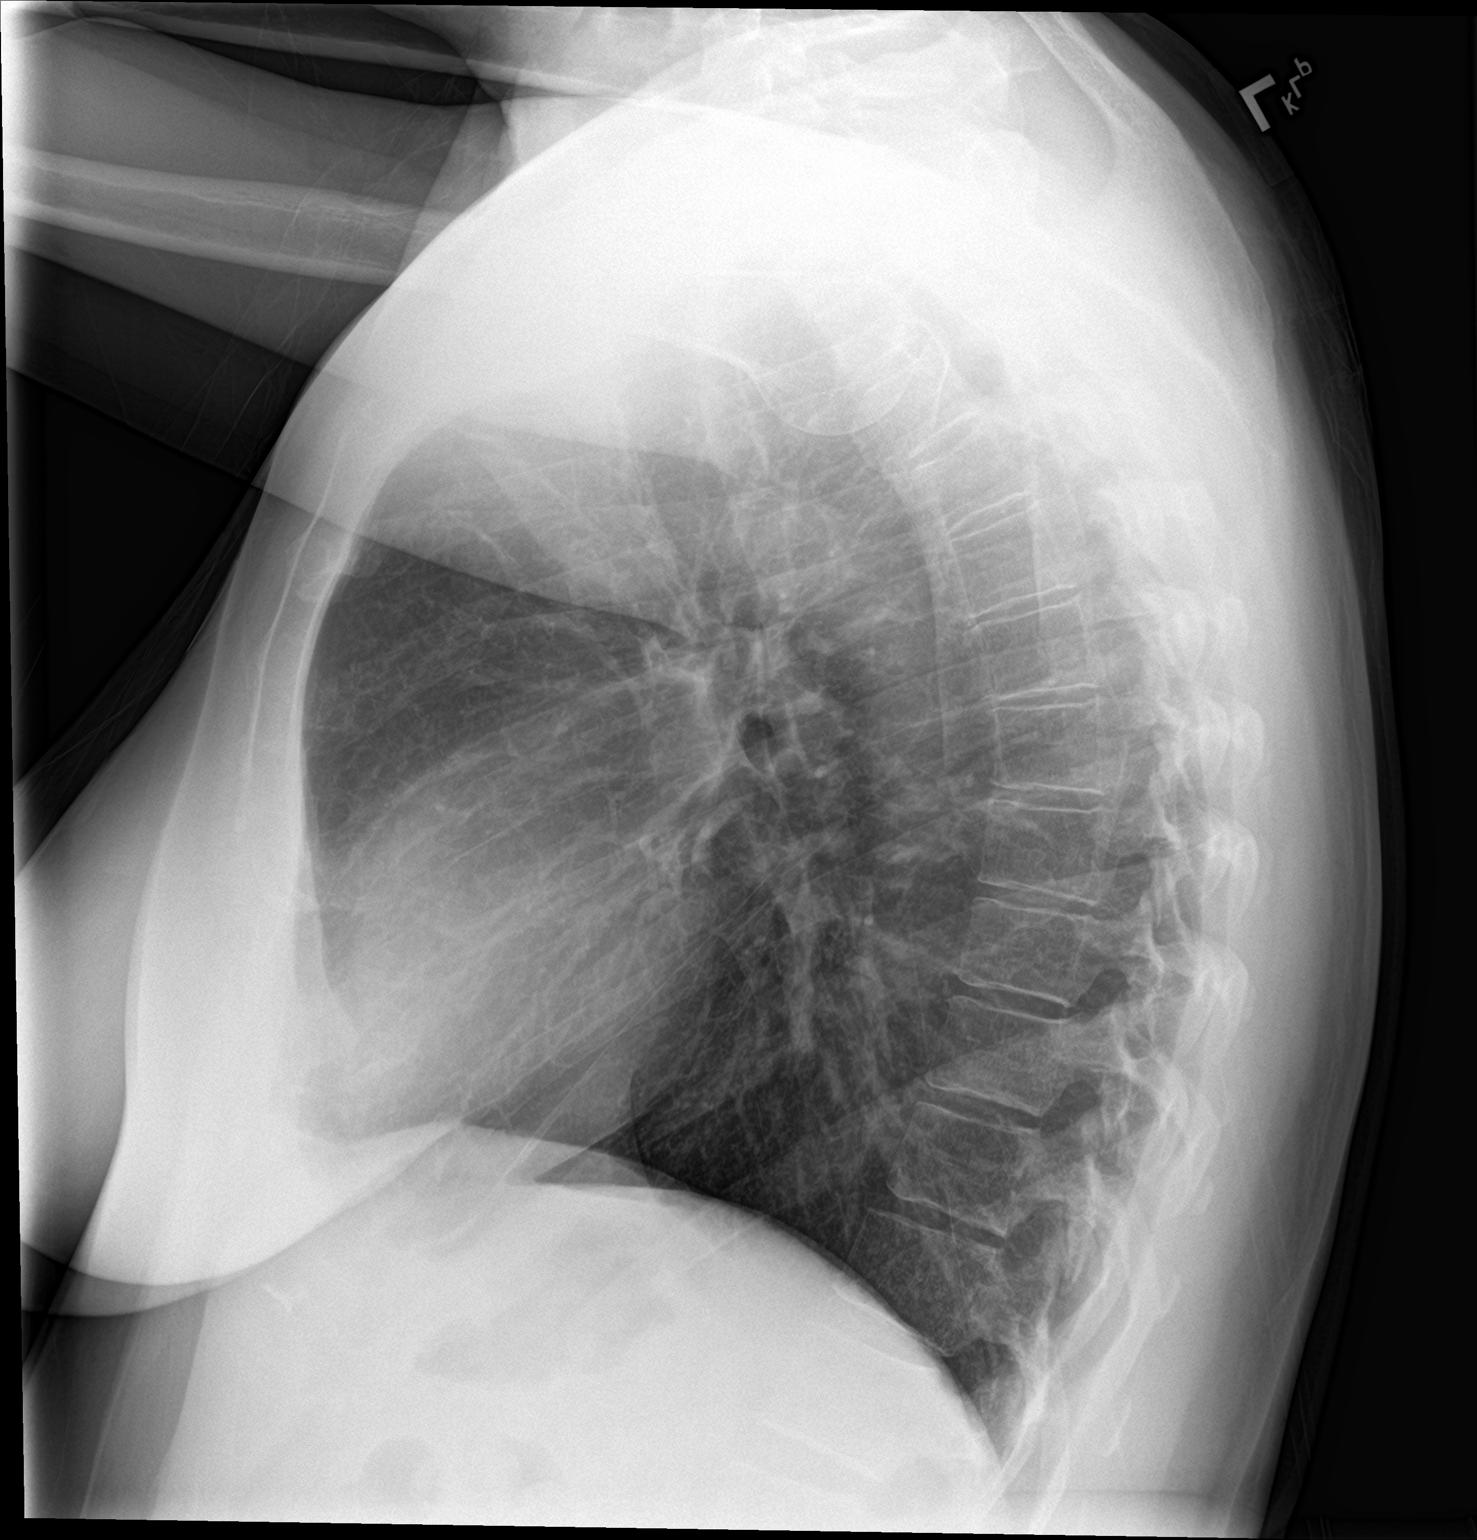

[2 of 2 positions shown; findings below may reference images not displayed]

FINDINGS: Stable lung volumes at the upper limits of normal to mildly
enlarged. Mediastinal contours remain normal. Mild increased
pulmonary interstitial markings are stable, likely related to
smoking. No pneumothorax, pulmonary edema, pleural effusion or
confluent pulmonary opacity. No acute osseous abnormality
identified. Negative visible bowel gas pattern.
IMPRESSION: 1.  No acute cardiopulmonary abnormality.
2. Borderline to mild pulmonary hyperinflation.

## 2020-01-21 ENCOUNTER — Other Ambulatory Visit: Payer: Self-pay

## 2020-02-07 ENCOUNTER — Emergency Department: Payer: HRSA Program

## 2020-02-07 ENCOUNTER — Other Ambulatory Visit: Payer: Self-pay

## 2020-02-07 ENCOUNTER — Emergency Department
Admission: EM | Admit: 2020-02-07 | Discharge: 2020-02-07 | Disposition: A | Discharge status: Home / Self Care | Payer: HRSA Program | Attending: Emergency Medicine | Admitting: Emergency Medicine

## 2020-02-07 ENCOUNTER — Encounter: Payer: Self-pay | Admitting: Emergency Medicine

## 2020-02-07 DIAGNOSIS — U071 COVID-19: Secondary | ICD-10-CM | POA: Insufficient documentation

## 2020-02-07 DIAGNOSIS — F17210 Nicotine dependence, cigarettes, uncomplicated: Secondary | ICD-10-CM | POA: Diagnosis not present

## 2020-02-07 DIAGNOSIS — R0981 Nasal congestion: Secondary | ICD-10-CM | POA: Diagnosis present

## 2020-02-07 DIAGNOSIS — J069 Acute upper respiratory infection, unspecified: Secondary | ICD-10-CM | POA: Diagnosis not present

## 2020-02-07 DIAGNOSIS — J45909 Unspecified asthma, uncomplicated: Secondary | ICD-10-CM | POA: Diagnosis not present

## 2020-02-07 DIAGNOSIS — Z7951 Long term (current) use of inhaled steroids: Secondary | ICD-10-CM | POA: Insufficient documentation

## 2020-02-07 DIAGNOSIS — Z720 Tobacco use: Secondary | ICD-10-CM

## 2020-02-07 LAB — SARS CORONAVIRUS 2 BY RT PCR (HOSPITAL ORDER, PERFORMED IN ~~LOC~~ HOSPITAL LAB): SARS Coronavirus 2: POSITIVE — AB

## 2020-02-07 MED ORDER — IBUPROFEN 400 MG PO TABS
400.0000 mg | ORAL_TABLET | Freq: Once | ORAL | Status: AC
  Administered 2020-02-07: 400 mg via ORAL
  Filled 2020-02-07: qty 1

## 2020-02-07 MED ORDER — BENZONATATE 100 MG PO CAPS: 100.0000 mg | ORAL_CAPSULE | Freq: Three times a day (TID) | ORAL | 0 refills | Status: AC | PRN

## 2020-02-07 NOTE — ED Notes (Signed)
Pt sent son to stat desk asking for a warm blanket for pt. Made aware that pt needs to be triaged to check for fever first.

## 2020-02-07 NOTE — ED Triage Notes (Signed)
Patient presents to the ED with cough/congestion, body aches and chills.  Patient denies fever, denies loss of taste and smell but reports no appetite.  Symptoms began 2 days ago.  Patient denies vomiting and diarrhea.

## 2020-02-07 NOTE — ED Provider Notes (Addendum)
[facility] Emergency Department Provider Note  ____________________________________________   First MD Initiated Contact with Patient 02/07/20 1844     (approximate)  I have reviewed the triage vital signs and the nursing notes.   HISTORY  Chief Complaint Nasal Congestion and Cough   HPI Alexis Gilbert is a 49 y.o. female with past medical history of asthma and tobacco abuse who presents for assessment approximately 2 days of generalized myalgias associated with headaches, chills, subjective fevers, poor p.o. intake, and acute on chronic cough and sputum production.  Patient denies any measured fevers, vomiting, diarrhea, dysuria, blood in stool, blood in her urine, rash, focal extremity pain, or other acute complaints.  She states when she is coughing less some chest tightness but no other chest pain or back pain otherwise.  She states she has been cutting down her cigarettes.  She denies daily EtOH or illicit drug use.  Patient states she has only received 1 dose of the COVID-19 vaccine.         Past Medical History:  Diagnosis Date  . Asthma     Patient Active Problem List   Diagnosis Date Noted  . Dysphagia 03/07/2018    Past Surgical History:  Procedure Laterality Date  . ABDOMINAL HYSTERECTOMY    . hemmorhoidectomy    . HERNIA REPAIR    . TUBAL LIGATION      Prior to Admission medications   Medication Sig Start Date End Date Taking? Authorizing Provider  albuterol (VENTOLIN HFA) 108 (90 Base) MCG/ACT inhaler Inhale 2 puffs into the lungs every 6 (six) hours as needed for wheezing or shortness of breath. 03/02/19   Verlee Monte, NP  brompheniramine-pseudoephedrine-DM 30-2-10 MG/5ML syrup Take 5 mLs by mouth 4 (four) times daily as needed. 03/02/19   Verlee Monte, NP  buPROPion (WELLBUTRIN XL) 150 MG 24 hr tablet Take 150 mg by mouth daily.    [provider]  doxycycline (VIBRAMYCIN) 100 MG capsule Take 1 capsule (100 mg total)  by mouth 2 (two) times daily. 03/02/19   Verlee Monte, NP  escitalopram (LEXAPRO) 20 MG tablet Take 1 tablet by mouth daily. 12/29/15 03/02/19  [provider]  fluticasone (FLONASE) 50 MCG/ACT nasal spray Place 2 sprays into both nostrils daily. 06/03/18 03/02/19  Janalyn Harder, PA-C  levonorgestrel (MIRENA) 20 MCG/24HR IUD 1 each once by Intrauterine route.  03/02/19  [provider]    Allergies Codeine and Vicodin [hydrocodone-acetaminophen]  No family history on file.  Social History Social History   Tobacco Use  . Smoking status: Current Every Day Smoker    Packs/day: 0.30    Types: Cigarettes  . Smokeless tobacco: Never Used  Vaping Use  . Vaping Use: Never used  Substance Use Topics  . Alcohol use: No  . Drug use: Not Currently    Review of Systems  Review of Systems  Constitutional: Positive for chills, fever and malaise/fatigue.  HENT: Negative for sore throat.   Eyes: Negative for pain.  Respiratory: Positive for cough and shortness of breath. Negative for stridor.   Cardiovascular: Positive for chest pain.  Gastrointestinal: Negative for diarrhea and vomiting.  Genitourinary: Negative for dysuria.  Musculoskeletal: Positive for myalgias.  Skin: Negative for rash.  Neurological: Negative for seizures, loss of consciousness and headaches.  Psychiatric/Behavioral: Negative for suicidal ideas.  All other systems reviewed and are negative.     ____________________________________________   PHYSICAL EXAM:  VITAL SIGNS: ED Triage Vitals  Enc Vitals  Group     BP 02/07/20 1637 114/84     Pulse Rate 02/07/20 1637 88     Resp 02/07/20 1637 20     Temp 02/07/20 1637 98.7 F (37.1 C)     Temp Source 02/07/20 1637 Oral     SpO2 02/07/20 1637 96 %     Weight 02/07/20 1638 201 lb (91.2 kg)     Height 02/07/20 1638 5\' 6"  (1.676 m)     Head Circumference --      Peak Flow --      Pain Score 02/07/20 1642 10     Pain Loc --      Pain Edu? --       Excl. in GC? --    Vitals:   02/07/20 1637 02/07/20 1748  BP: 114/84 126/87  Pulse: 88 87  Resp: 20   Temp: 98.7 F (37.1 C) 98.8 F (37.1 C)  SpO2: 96% 98%   Physical Exam Vitals and nursing note reviewed.  Constitutional:      General: She is not in acute distress.    Appearance: She is well-developed.  HENT:     Head: Normocephalic and atraumatic.     Right Ear: External ear normal.     Left Ear: External ear normal.     Nose: Nose normal.     Mouth/Throat:     Mouth: Mucous membranes are moist.  Eyes:     Conjunctiva/sclera: Conjunctivae normal.  Cardiovascular:     Rate and Rhythm: Normal rate and regular rhythm.     Pulses: Normal pulses.     Heart sounds: No murmur heard.   Pulmonary:     Effort: Pulmonary effort is normal. No respiratory distress.     Breath sounds: Normal breath sounds.  Abdominal:     Palpations: Abdomen is soft.     Tenderness: There is no abdominal tenderness.  Musculoskeletal:     Cervical back: Neck supple.  Skin:    General: Skin is warm and dry.     Capillary Refill: Capillary refill takes less than 2 seconds.  Neurological:     Mental Status: She is alert and oriented to person, place, and time.  Psychiatric:        Mood and Affect: Mood normal.      ____________________________________________   LABS (all labs ordered are listed, but only abnormal results are displayed)  Labs Reviewed  SARS CORONAVIRUS 2 BY RT PCR (HOSPITAL ORDER, PERFORMED IN [facility]) - Abnormal; Notable for the following components:      Result Value   SARS Coronavirus 2 POSITIVE (*)    All other components within normal limits   ____________________________________________  EKG  Sinus rhythm with a ventricular rate of 87, normal axis, unremarkable levels, no clear evidence of acute ischemia or other significant underlying arrhythmia. ____________________________________________  RADIOLOGY  ED MD interpretation: No evidence of  focal consolidation suggestive of bacterial pneumonia, pneumothorax, effusion, widening this time, or other acute thoracic process.  Official radiology report(s): DG Chest 2 View  Result Date: 02/07/2020 CLINICAL DATA:  Cough EXAM: CHEST - 2 VIEW COMPARISON:  Chest radiograph dated 03/02/2019 FINDINGS: The heart size and mediastinal contours are within normal limits. Both lungs are clear. The visualized skeletal structures are unremarkable. IMPRESSION: No active cardiopulmonary disease. Electronically Signed   By: 05/02/2019 M.D.   On: 02/07/2020 19:19    ____________________________________________   PROCEDURES  Procedure(s) performed (including Critical Care):  Procedures   ____________________________________________  INITIAL IMPRESSION / ASSESSMENT AND PLAN / ED COURSE        Patient presents with above-stated history exam for assessment of generalized myalgias associated with malaise, fatigue, and acute on subacute cough over the last 2 days.  Patient is afebrile hemodynamically stable arrival.  Exam as above.  Impression is likely viral syndrome with COVID-19 high within differential.  Is possible patient has non-Covid related viral bronchitis as well.  She states she had been coughing for several weeks prior to onset of other symptoms.  Low suspicion ACS, PE, or dissection at this time.  Patient is not septic.  Chest x-ray shows no evidence of consolidative process suggestive of pneumonia, volume overload, or effusion.  Covid PCR sent.  It did return POS. Discussed with patient appropriate isolation precaution.  Also discussed appropriate dosing and scheduling of Tylenol and Motrin pending Covid results.  Patient discharged stable condition.  Strict return precautions advised and discussed.  Counseled patient on tobacco cessation.  ____________________________________________   FINAL CLINICAL IMPRESSION(S) / ED DIAGNOSES  Final diagnoses:  Upper respiratory tract  infection, unspecified type  Tobacco abuse  COVID-19    Medications  ibuprofen (ADVIL) tablet 400 mg (400 mg Oral Given 02/07/20 1936)     ED Discharge Orders    None       Note:  This document was prepared using Dragon voice recognition software and may include unintentional dictation errors.   Gilles Chiquito, MD 02/07/20 1941    Gilles Chiquito, MD 02/07/20 2007

## 2020-02-08 ENCOUNTER — Other Ambulatory Visit (HOSPITAL_COMMUNITY): Payer: Self-pay | Admitting: Family

## 2020-02-08 DIAGNOSIS — U071 COVID-19: Secondary | ICD-10-CM

## 2020-02-08 NOTE — Progress Notes (Signed)
I connected by phone with Alexis Gilbert on 02/08/2020 at 7:38 PM to discuss the potential use of a new treatment for mild to moderate COVID-19 viral infection in non-hospitalized patients.  This patient is a 49 y.o. female that meets the FDA criteria for Emergency Use Authorization of COVID monoclonal antibody casirivimab/imdevimab.  Has a (+) direct SARS-CoV-2 viral test result  Has mild or moderate COVID-19   Is NOT hospitalized due to COVID-19  Is within 10 days of symptom onset  Has at least one of the high risk factor(s) for progression to severe COVID-19 and/or hospitalization as defined in EUA.  Specific high risk criteria : Chronic Lung Disease Symptoms of cough, congestion, headache, muscle ache began 02/05/20.   I have spoken and communicated the following to the patient or parent/caregiver regarding COVID monoclonal antibody treatment:  1. FDA has authorized the emergency use for the treatment of mild to moderate COVID-19 in adults and pediatric patients with positive results of direct SARS-CoV-2 viral testing who are 41 years of age and older weighing at least 40 kg, and who are at high risk for progressing to severe COVID-19 and/or hospitalization.  2. The significant known and potential risks and benefits of COVID monoclonal antibody, and the extent to which such potential risks and benefits are unknown.  3. Information on available alternative treatments and the risks and benefits of those alternatives, including clinical trials.  4. Patients treated with COVID monoclonal antibody should continue to self-isolate and use infection control measures (e.g., wear mask, isolate, social distance, avoid sharing personal items, clean and disinfect "high touch" surfaces, and frequent handwashing) according to CDC guidelines.   5. The patient or parent/caregiver has the option to accept or refuse COVID monoclonal antibody treatment.  After reviewing this information with the  patient, The patient agreed to proceed with receiving casirivimab\imdevimab infusion and will be provided a copy of the Fact sheet prior to receiving the infusion. [name] 02/08/2020 7:38 PM

## 2020-02-10 ENCOUNTER — Ambulatory Visit (HOSPITAL_COMMUNITY): Payer: Self-pay

## 2020-09-11 ENCOUNTER — Encounter: Payer: Self-pay | Admitting: Emergency Medicine

## 2020-09-11 ENCOUNTER — Ambulatory Visit
Admission: EM | Admit: 2020-09-11 | Discharge: 2020-09-11 | Disposition: A | Discharge status: Home Health | Payer: Self-pay | Attending: Family Medicine | Admitting: Family Medicine

## 2020-09-11 ENCOUNTER — Other Ambulatory Visit: Payer: Self-pay

## 2020-09-11 DIAGNOSIS — B9789 Other viral agents as the cause of diseases classified elsewhere: Secondary | ICD-10-CM

## 2020-09-11 DIAGNOSIS — J988 Other specified respiratory disorders: Secondary | ICD-10-CM

## 2020-09-11 MED ORDER — PREDNISONE 50 MG PO TABS: ORAL_TABLET | ORAL | 0 refills | Status: DC

## 2020-09-11 MED ORDER — PROMETHAZINE-DM 6.25-15 MG/5ML PO SYRP: 5.0000 mL | ORAL_SOLUTION | Freq: Four times a day (QID) | ORAL | 0 refills | Status: DC | PRN

## 2020-09-11 MED ORDER — ALBUTEROL SULFATE HFA 108 (90 BASE) MCG/ACT IN AERS: 1.0000 | INHALATION_SPRAY | Freq: Four times a day (QID) | RESPIRATORY_TRACT | 0 refills | Status: DC | PRN

## 2020-09-11 MED ORDER — CETIRIZINE-PSEUDOEPHEDRINE ER 5-120 MG PO TB12: 1.0000 | ORAL_TABLET | Freq: Two times a day (BID) | ORAL | 0 refills | Status: DC

## 2020-09-11 NOTE — Discharge Instructions (Signed)
Rest. Fluids.  Medication as prescribed.   Take care  Dr. Bayan Hedstrom  

## 2020-09-11 NOTE — ED Provider Notes (Signed)
[facility]    CSN: [id number] Arrival date & time: 09/11/20  1207      History   Chief Complaint Chief Complaint  Patient presents with  . Cough  . Nasal Congestion   HPI  50 year old female presents with respiratory symptoms.  Patient reports that she had symptoms since Saturday.  She reports cough, wheezing.  Also reports nasal congestion.  She is a smoker.  No fever.  She had Covid testing this morning which was negative.  She reports use of Mucinex without resolution.  Cough is interfering with sleep.  No other associated symptoms.  No other complaints.  Past Medical History:  Diagnosis Date  . Asthma     Patient Active Problem List   Diagnosis Date Noted  . Dysphagia 03/07/2018    Past Surgical History:  Procedure Laterality Date  . ABDOMINAL HYSTERECTOMY    . hemmorhoidectomy    . HERNIA REPAIR    . TUBAL LIGATION      OB History   No obstetric history on file.      Home Medications    Prior to Admission medications   Medication Sig Start Date End Date Taking? Authorizing Provider  albuterol (VENTOLIN HFA) 108 (90 Base) MCG/ACT inhaler Inhale 1-2 puffs into the lungs every 6 (six) hours as needed for wheezing or shortness of breath. 09/11/20  Yes Miyonna Ormiston G, DO  cetirizine-pseudoephedrine (ZYRTEC-D) 5-120 MG tablet Take 1 tablet by mouth 2 (two) times daily. 09/11/20  Yes Dwayne Bulkley G, DO  predniSONE (DELTASONE) 50 MG tablet 1 tablet daily x 5 days 09/11/20  Yes Osman Calzadilla G, DO  promethazine-dextromethorphan (PROMETHAZINE-DM) 6.25-15 MG/5ML syrup Take 5 mLs by mouth 4 (four) times daily as needed for cough. 09/11/20  Yes Cooper Moroney G, DO  sertraline (ZOLOFT) 25 MG tablet Take 1 tablet by mouth 2 (two) times daily. 12/31/19  Yes [provider]  buPROPion (WELLBUTRIN XL) 150 MG 24 hr tablet Take 150 mg by mouth daily.  09/11/20  [provider]  escitalopram (LEXAPRO) 20 MG tablet Take 1 tablet by mouth daily. 12/29/15 03/02/19   [provider]  fluticasone (FLONASE) 50 MCG/ACT nasal spray Place 2 sprays into both nostrils daily. 06/03/18 03/02/19  Janalyn Harder, PA-C  levonorgestrel (MIRENA) 20 MCG/24HR IUD 1 each once by Intrauterine route.  03/02/19  [provider]    Family History History reviewed. No pertinent family history.  Social History Social History   Tobacco Use  . Smoking status: Current Every Day Smoker    Packs/day: 0.30    Types: Cigarettes  . Smokeless tobacco: Never Used  Vaping Use  . Vaping Use: Never used  Substance Use Topics  . Alcohol use: No  . Drug use: Not Currently     Allergies   Codeine and Vicodin [hydrocodone-acetaminophen]   Review of Systems Review of Systems  HENT: Positive for congestion.   Respiratory: Positive for cough and wheezing.    Physical Exam Triage Vital Signs ED Triage Vitals  Enc Vitals Group     BP 09/11/20 1240 124/88     Pulse Rate 09/11/20 1240 72     Resp 09/11/20 1240 18     Temp 09/11/20 1240 98.1 F (36.7 C)     Temp Source 09/11/20 1240 Oral     SpO2 09/11/20 1240 99 %     Weight 09/11/20 1239 201 lb 1 oz (91.2 kg)     Height 09/11/20 1239 5\' 6"  (1.676 m)  Head Circumference --      Peak Flow --      Pain Score 09/11/20 1239 0     Pain Loc --      Pain Edu? --      Excl. in GC? --    Updated Vital Signs BP 124/88 (BP Location: Right Arm)   Pulse 72   Temp 98.1 F (36.7 C) (Oral)   Resp 18   Ht 5\' 6"  (1.676 m)   Wt 91.2 kg   LMP  (LMP Unknown)   SpO2 99%   BMI 32.45 kg/m   Visual Acuity Right Eye Distance:   Left Eye Distance:   Bilateral Distance:    Right Eye Near:   Left Eye Near:    Bilateral Near:     Physical Exam Constitutional:      General: She is not in acute distress.    Appearance: Normal appearance. She is obese. She is not ill-appearing.  HENT:     Head: Normocephalic and atraumatic.     Right Ear: Tympanic membrane normal.     Left Ear: Tympanic membrane normal.      Mouth/Throat:     Pharynx: Oropharynx is clear. No oropharyngeal exudate.  Eyes:     General:        Right eye: No discharge.        Left eye: No discharge.     Conjunctiva/sclera: Conjunctivae normal.  Pulmonary:     Effort: Pulmonary effort is normal.     Breath sounds: Wheezing present.  Neurological:     Mental Status: She is alert.  Psychiatric:        Mood and Affect: Mood normal.        Behavior: Behavior normal.    UC Treatments / Results  Labs (all labs ordered are listed, but only abnormal results are displayed) Labs Reviewed - No data to display  EKG   Radiology No results found.  Procedures Procedures (including critical care time)  Medications Ordered in UC Medications - No data to display  Initial Impression / Assessment and Plan / UC Course  I have reviewed the triage vital signs and the nursing notes.  Pertinent labs & imaging results that were available during my care of the patient were reviewed by me and considered in my medical decision making (see chart for details).    50 year old female presents with respiratory infection.  Viral in origin.  Zyrtec-D, prednisone, albuterol, and Promethazine DM.  Final Clinical Impressions(s) / UC Diagnoses   Final diagnoses:  Viral respiratory infection     Discharge Instructions     Rest.  Fluids.  Medication as prescribed.  Take care  Dr. 57    ED Prescriptions    Medication Sig Dispense Auth. Provider   cetirizine-pseudoephedrine (ZYRTEC-D) 5-120 MG tablet Take 1 tablet by mouth 2 (two) times daily. 30 tablet Fryda Molenda G, DO   predniSONE (DELTASONE) 50 MG tablet 1 tablet daily x 5 days 5 tablet Raye Wiens G, DO   albuterol (VENTOLIN HFA) 108 (90 Base) MCG/ACT inhaler Inhale 1-2 puffs into the lungs every 6 (six) hours as needed for wheezing or shortness of breath. 18 g Gussie Murton G, DO   promethazine-dextromethorphan (PROMETHAZINE-DM) 6.25-15 MG/5ML syrup Take 5 mLs by mouth 4 (four)  times daily as needed for cough. 118 mL 07-29-1990, DO     PDMP not reviewed this encounter.   Tommie Sams, Tommie Sams 09/11/20 1317

## 2020-09-11 NOTE — ED Triage Notes (Signed)
Patient c/o cough and nasal congestion that started Saturday. Denies fever. Patient was already swabbed for COVID this am and this was negative.

## 2020-09-15 ENCOUNTER — Encounter: Payer: Self-pay | Admitting: Emergency Medicine

## 2020-09-15 ENCOUNTER — Other Ambulatory Visit: Payer: Self-pay

## 2020-09-15 ENCOUNTER — Ambulatory Visit
Admission: EM | Admit: 2020-09-15 | Discharge: 2020-09-15 | Disposition: A | Discharge status: Home / Self Care | Payer: Self-pay | Attending: Emergency Medicine | Admitting: Emergency Medicine

## 2020-09-15 DIAGNOSIS — J069 Acute upper respiratory infection, unspecified: Secondary | ICD-10-CM

## 2020-09-15 MED ORDER — IPRATROPIUM BROMIDE 0.06 % NA SOLN: 2.0000 | Freq: Four times a day (QID) | NASAL | 12 refills | Status: DC

## 2020-09-15 MED ORDER — BENZONATATE 100 MG PO CAPS: 200.0000 mg | ORAL_CAPSULE | Freq: Three times a day (TID) | ORAL | 0 refills | Status: DC

## 2020-09-15 MED ORDER — DOXYCYCLINE HYCLATE 100 MG PO CAPS: 100.0000 mg | ORAL_CAPSULE | Freq: Two times a day (BID) | ORAL | 0 refills | Status: AC

## 2020-09-15 NOTE — Discharge Instructions (Addendum)
Continue taking her previously prescribed medicines as they are prescribed.  Start taking the doxycycline today.  You will take it twice daily for period of 7 days to address any possible bacterial component of your illness.  Use the Tessalon Perles every 8 hours during the day as needed for cough.  Use the ipratropium nasal spray, 2 squirts in each nostril every 6 hours, as needed for nasal congestion and postnasal drip.  Return for reevaluation for any new or worsening symptoms.

## 2020-09-15 NOTE — ED Triage Notes (Signed)
Pt is present today with a cough and nasal congestion. Pt states that when she does cough a pain shoots through the back of her neck. Pt states that she has not gotten any better with the medication that were prescribed recently.

## 2020-09-15 NOTE — ED Provider Notes (Signed)
[facility]    CSN: [id number] Arrival date & time: 09/15/20  1106      History   Chief Complaint Chief Complaint  Patient presents with  . Cough  . Nasal Congestion    HPI Alexis Gilbert is a 50 y.o. female.   HPI   50 year old female here for reevaluation of respiratory complaints.  Patient was evaluated 4 days ago for cough and nasal congestion and wheezing.  At that time she was prescribed Zyrtec-D, albuterol inhaler, prednisone, and Promethazine DM cough syrup.  Patient reports that she is continue to have a cough, shortness of breath and wheezing, runny nose, nasal congestion, productive cough, and ear pain and pressure.  She states that her nasal discharge and sputum are clear.  She denies fever, nausea, vomiting, or diarrhea.  Patient reports that she usually gets help for her cough with Tessalon Perles.  Past Medical History:  Diagnosis Date  . Asthma     Patient Active Problem List   Diagnosis Date Noted  . Dysphagia 03/07/2018    Past Surgical History:  Procedure Laterality Date  . ABDOMINAL HYSTERECTOMY    . hemmorhoidectomy    . HERNIA REPAIR    . TUBAL LIGATION      OB History   No obstetric history on file.      Home Medications    Prior to Admission medications   Medication Sig Start Date End Date Taking? Authorizing Provider  benzonatate (TESSALON) 100 MG capsule Take 2 capsules (200 mg total) by mouth every 8 (eight) hours. 09/15/20  Yes Becky Augusta, NP  doxycycline (VIBRAMYCIN) 100 MG capsule Take 1 capsule (100 mg total) by mouth 2 (two) times daily for 7 days. 09/15/20 09/22/20 Yes Becky Augusta, NP  ipratropium (ATROVENT) 0.06 % nasal spray Place 2 sprays into both nostrils 4 (four) times daily. 09/15/20  Yes Becky Augusta, NP  albuterol (VENTOLIN HFA) 108 (90 Base) MCG/ACT inhaler Inhale 1-2 puffs into the lungs every 6 (six) hours as needed for wheezing or shortness of breath. 09/11/20   Tommie Sams, DO   cetirizine-pseudoephedrine (ZYRTEC-D) 5-120 MG tablet Take 1 tablet by mouth 2 (two) times daily. 09/11/20   Tommie Sams, DO  predniSONE (DELTASONE) 50 MG tablet 1 tablet daily x 5 days 09/11/20   Tommie Sams, DO  promethazine-dextromethorphan (PROMETHAZINE-DM) 6.25-15 MG/5ML syrup Take 5 mLs by mouth 4 (four) times daily as needed for cough. 09/11/20   Tommie Sams, DO  sertraline (ZOLOFT) 25 MG tablet Take 1 tablet by mouth 2 (two) times daily. 12/31/19   [provider]  buPROPion (WELLBUTRIN XL) 150 MG 24 hr tablet Take 150 mg by mouth daily.  09/11/20  [provider]  escitalopram (LEXAPRO) 20 MG tablet Take 1 tablet by mouth daily. 12/29/15 03/02/19  [provider]  fluticasone (FLONASE) 50 MCG/ACT nasal spray Place 2 sprays into both nostrils daily. 06/03/18 03/02/19  Janalyn Harder, PA-C  levonorgestrel (MIRENA) 20 MCG/24HR IUD 1 each once by Intrauterine route.  03/02/19  [provider]    Family History History reviewed. No pertinent family history.  Social History Social History   Tobacco Use  . Smoking status: Current Every Day Smoker    Packs/day: 0.30    Types: Cigarettes  . Smokeless tobacco: Never Used  Vaping Use  . Vaping Use: Never used  Substance Use Topics  . Alcohol use: No  . Drug use: Not Currently     Allergies   Codeine  and Vicodin [hydrocodone-acetaminophen]   Review of Systems Review of Systems  Constitutional: Negative for activity change, appetite change and fever.  HENT: Positive for congestion, postnasal drip, rhinorrhea and sinus pressure.   Respiratory: Positive for cough, shortness of breath and wheezing.   Musculoskeletal: Negative for arthralgias and myalgias.  Skin: Negative for rash.  Hematological: Negative.   Psychiatric/Behavioral: Negative.      Physical Exam Triage Vital Signs ED Triage Vitals  Enc Vitals Group     BP 09/15/20 1127 126/84     Pulse Rate 09/15/20 1127 71     Resp 09/15/20  1127 17     Temp 09/15/20 1127 98.1 F (36.7 C)     Temp Source 09/15/20 1127 Oral     SpO2 09/15/20 1127 99 %     Weight --      Height --      Head Circumference --      Peak Flow --      Pain Score 09/15/20 1126 7     Pain Loc --      Pain Edu? --      Excl. in GC? --    No data found.  Updated Vital Signs BP 126/84 (BP Location: Left Arm)   Pulse 71   Temp 98.1 F (36.7 C) (Oral)   Resp 17   LMP  (LMP Unknown)   SpO2 99%   Visual Acuity Right Eye Distance:   Left Eye Distance:   Bilateral Distance:    Right Eye Near:   Left Eye Near:    Bilateral Near:     Physical Exam Vitals and nursing note reviewed.  Constitutional:      General: She is not in acute distress.    Appearance: Normal appearance. She is normal weight. She is not ill-appearing.  HENT:     Head: Normocephalic and atraumatic.     Right Ear: Tympanic membrane, ear canal and external ear normal. There is no impacted cerumen.     Left Ear: Tympanic membrane, ear canal and external ear normal. There is no impacted cerumen.     Nose: Congestion and rhinorrhea present.     Mouth/Throat:     Mouth: Mucous membranes are moist.     Pharynx: Oropharynx is clear. Posterior oropharyngeal erythema present.  Cardiovascular:     Rate and Rhythm: Normal rate and regular rhythm.     Pulses: Normal pulses.     Heart sounds: Normal heart sounds. No murmur heard.   Pulmonary:     Effort: Pulmonary effort is normal.     Breath sounds: Normal breath sounds. No wheezing, rhonchi or rales.  Musculoskeletal:     Cervical back: Normal range of motion and neck supple.  Lymphadenopathy:     Cervical: No cervical adenopathy.  Skin:    General: Skin is warm and dry.     Capillary Refill: Capillary refill takes less than 2 seconds.     Findings: No erythema or rash.  Neurological:     General: No focal deficit present.     Mental Status: She is alert and oriented to person, place, and time.  Psychiatric:         Mood and Affect: Mood normal.        Behavior: Behavior normal.        Thought Content: Thought content normal.        Judgment: Judgment normal.      UC Treatments / Results  Labs (all labs ordered are  listed, but only abnormal results are displayed) Labs Reviewed - No data to display  EKG   Radiology No results found.  Procedures Procedures (including critical care time)  Medications Ordered in UC Medications - No data to display  Initial Impression / Assessment and Plan / UC Course  I have reviewed the triage vital signs and the nursing notes.  Pertinent labs & imaging results that were available during my care of the patient were reviewed by me and considered in my medical decision making (see chart for details).   Patient is a very pleasant and nontoxic-appearing 50 year old female here for evaluation of cough and congestion that have been going on for a week.  Patient reports that her nasal discharge and sputum production are both clear and she has not had a fever.  She has been using Promethazine DM cough syrup, Zyrtec-D and has been taking prednisone but this has not helped her cough or her nasal congestion.  Patient is also been using her albuterol inhaler which she reports does not help her shortness of breath or wheezing.  Patient is a smoker but has not smoked in the last week.  Due to patient failing conservative measures we will do a trial of doxycycline twice daily for 7 days as well as prescribe Tessalon Perles and ipratropium nasal spray to try and help with her postnasal drip which might be driving her cough.   Final Clinical Impressions(s) / UC Diagnoses   Final diagnoses:  Upper respiratory tract infection, unspecified type     Discharge Instructions     Continue taking her previously prescribed medicines as they are prescribed.  Start taking the doxycycline today.  You will take it twice daily for period of 7 days to address any possible bacterial  component of your illness.  Use the Tessalon Perles every 8 hours during the day as needed for cough.  Use the ipratropium nasal spray, 2 squirts in each nostril every 6 hours, as needed for nasal congestion and postnasal drip.  Return for reevaluation for any new or worsening symptoms.    ED Prescriptions    Medication Sig Dispense Auth. Provider   doxycycline (VIBRAMYCIN) 100 MG capsule Take 1 capsule (100 mg total) by mouth 2 (two) times daily for 7 days. 14 capsule Becky Augusta, NP   ipratropium (ATROVENT) 0.06 % nasal spray Place 2 sprays into both nostrils 4 (four) times daily. 15 mL Becky Augusta, NP   benzonatate (TESSALON) 100 MG capsule Take 2 capsules (200 mg total) by mouth every 8 (eight) hours. 21 capsule Becky Augusta, NP     PDMP not reviewed this encounter.   Becky Augusta, NP 09/15/20 1155

## 2020-10-10 ENCOUNTER — Other Ambulatory Visit: Payer: Self-pay

## 2020-10-10 ENCOUNTER — Ambulatory Visit
Admission: EM | Admit: 2020-10-10 | Discharge: 2020-10-10 | Disposition: A | Discharge status: Home / Self Care | Payer: Self-pay | Attending: Sports Medicine | Admitting: Sports Medicine

## 2020-10-10 DIAGNOSIS — R1024 Suprapubic pain: Secondary | ICD-10-CM

## 2020-10-10 DIAGNOSIS — R10A0 Flank pain, unspecified side: Secondary | ICD-10-CM

## 2020-10-10 DIAGNOSIS — N390 Urinary tract infection, site not specified: Secondary | ICD-10-CM

## 2020-10-10 DIAGNOSIS — R3915 Urgency of urination: Secondary | ICD-10-CM

## 2020-10-10 DIAGNOSIS — R102 Pelvic and perineal pain: Secondary | ICD-10-CM | POA: Insufficient documentation

## 2020-10-10 DIAGNOSIS — R350 Frequency of micturition: Secondary | ICD-10-CM

## 2020-10-10 DIAGNOSIS — R109 Unspecified abdominal pain: Secondary | ICD-10-CM | POA: Insufficient documentation

## 2020-10-10 DIAGNOSIS — R300 Dysuria: Secondary | ICD-10-CM

## 2020-10-10 LAB — URINALYSIS, COMPLETE (UACMP) WITH MICROSCOPIC
Glucose, UA: NEGATIVE mg/dL
Nitrite: NEGATIVE
Protein, ur: >300 mg/dL — AB
RBC / HPF: >50 RBC/hpf (ref 0–5)
Specific Gravity, Urine: >1.03 — ABNORMAL HIGH (ref 1.005–1.030)
WBC, UA: >50 WBC/hpf (ref 0–5)
pH: 5.5 (ref 5.0–8.0)

## 2020-10-10 MED ORDER — PHENAZOPYRIDINE HCL 200 MG PO TABS
200.0000 mg | ORAL_TABLET | Freq: Three times a day (TID) | ORAL | 0 refills | Status: DC
Start: 2020-10-10 — End: 2021-08-01

## 2020-10-10 MED ORDER — NITROFURANTOIN MONOHYD MACRO 100 MG PO CAPS: 100.0000 mg | ORAL_CAPSULE | Freq: Two times a day (BID) | ORAL | 0 refills | Status: DC

## 2020-10-10 NOTE — Discharge Instructions (Addendum)
Your urinalysis does show that you have a UTI.  I sent off the culture. Will empirically treat you with an antibiotic.  I also sent something on for your urinary symptoms.  Please go to the pharmacy and get both of these. Flush your system with plenty of water. You do a blood in your urine so this could be indicative of an ascending infection which would require IV antibiotics and you would need to go to the hospital, or kidney stones which would also require you to go to the hospital.  Just have a low threshold to go to the hospital if you are not improving or you are worsening. Educational handouts provided. I also put in a referral for you to get a primary care doctor since you do not have one.

## 2020-10-10 NOTE — ED Triage Notes (Signed)
Patient states that she has been having extreme bladder pressure, urgency and hematuria with sudden onset of 2 hours.

## 2020-10-12 NOTE — ED Provider Notes (Signed)
[facility]    CSN: [id number] Arrival date & time: 10/10/20  1747      History   Chief Complaint Chief Complaint  Patient presents with  . Urinary Frequency    HPI Alexis Gilbert is a 50 y.o. female.   Patient is a pleasant 50 year old female who presents for evaluation of the above issues.  She does not have a primary care physician.  She works over at [employer] in [place].  She works in Teacher, music.  She reports 2 to 3 hours of significant UTI symptoms.  She says it just began while she was at work.  Finished her shift and came here.  She has a lot of suprapubic pressure.  Increased urinary frequency and urgency.  She also has incomplete voiding.  She is noted hematuria as well.  She also has a little bit of flank pain.  She denies any vaginal discharge or pelvic pain.  She is not concerned about any STDs.  No fever shakes chills.  No nausea vomiting or diarrhea.  No chest pain or shortness of breath.  No red flag signs or symptoms elicited on history.      Past Medical History:  Diagnosis Date  . Asthma     Patient Active Problem List   Diagnosis Date Noted  . Dysphagia 03/07/2018    Past Surgical History:  Procedure Laterality Date  . ABDOMINAL HYSTERECTOMY    . hemmorhoidectomy    . HERNIA REPAIR    . TUBAL LIGATION      OB History   No obstetric history on file.      Home Medications    Prior to Admission medications   Medication Sig Start Date End Date Taking? Authorizing Provider  cetirizine-pseudoephedrine (ZYRTEC-D) 5-120 MG tablet Take 1 tablet by mouth 2 (two) times daily. 09/11/20  Yes Cook, Jayce G, DO  nitrofurantoin, macrocrystal-monohydrate, (MACROBID) 100 MG capsule Take 1 capsule (100 mg total) by mouth 2 (two) times daily. 10/10/20  Yes Delton See, MD  phenazopyridine (PYRIDIUM) 200 MG tablet Take 1 tablet (200 mg total) by mouth 3 (three) times daily. 10/10/20  Yes Delton See, MD  sertraline (ZOLOFT) 25 MG tablet  Take 1 tablet by mouth 2 (two) times daily. 12/31/19  Yes [provider]  albuterol (VENTOLIN HFA) 108 (90 Base) MCG/ACT inhaler Inhale 1-2 puffs into the lungs every 6 (six) hours as needed for wheezing or shortness of breath. 09/11/20   Tommie Sams, DO  benzonatate (TESSALON) 100 MG capsule Take 2 capsules (200 mg total) by mouth every 8 (eight) hours. 09/15/20   Becky Augusta, NP  ipratropium (ATROVENT) 0.06 % nasal spray Place 2 sprays into both nostrils 4 (four) times daily. 09/15/20   Becky Augusta, NP  predniSONE (DELTASONE) 50 MG tablet 1 tablet daily x 5 days 09/11/20   Tommie Sams, DO  promethazine-dextromethorphan (PROMETHAZINE-DM) 6.25-15 MG/5ML syrup Take 5 mLs by mouth 4 (four) times daily as needed for cough. 09/11/20   Tommie Sams, DO  buPROPion (WELLBUTRIN XL) 150 MG 24 hr tablet Take 150 mg by mouth daily.  09/11/20  [provider]  escitalopram (LEXAPRO) 20 MG tablet Take 1 tablet by mouth daily. 12/29/15 03/02/19  [provider]  fluticasone (FLONASE) 50 MCG/ACT nasal spray Place 2 sprays into both nostrils daily. 06/03/18 03/02/19  Janalyn Harder, PA-C  levonorgestrel (MIRENA) 20 MCG/24HR IUD 1 each once by Intrauterine route.  03/02/19  [provider]    Family History  History reviewed. No pertinent family history.  Social History Social History   Tobacco Use  . Smoking status: Current Every Day Smoker    Packs/day: 0.30    Types: Cigarettes  . Smokeless tobacco: Never Used  Vaping Use  . Vaping Use: Never used  Substance Use Topics  . Alcohol use: No  . Drug use: Not Currently     Allergies   Codeine and Vicodin [hydrocodone-acetaminophen]   Review of Systems Review of Systems  Constitutional: Positive for activity change. Negative for chills, diaphoresis, fatigue and fever.  HENT: Negative for congestion, ear pain, postnasal drip, rhinorrhea, sinus pressure, sinus pain, sneezing and sore throat.   Eyes: Negative for pain.   Respiratory: Negative for cough, chest tightness, shortness of breath and wheezing.   Cardiovascular: Negative for chest pain and palpitations.  Gastrointestinal: Positive for abdominal pain. Negative for diarrhea, nausea and vomiting.  Genitourinary: Positive for dysuria, flank pain, frequency, hematuria and urgency. Negative for pelvic pain, vaginal bleeding, vaginal discharge and vaginal pain.  Musculoskeletal: Negative for back pain, myalgias and neck pain.  Skin: Negative for color change, pallor, rash and wound.  Neurological: Negative for dizziness, light-headedness and headaches.  All other systems reviewed and are negative.    Physical Exam Triage Vital Signs ED Triage Vitals  Enc Vitals Group     BP 10/10/20 1859 133/82     Pulse Rate 10/10/20 1859 95     Resp 10/10/20 1859 18     Temp 10/10/20 1859 98.6 F (37 C)     Temp Source 10/10/20 1859 Oral     SpO2 10/10/20 1859 99 %     Weight 10/10/20 1857 203 lb (92.1 kg)     Height 10/10/20 1857 5' 5.5" (1.664 m)     Head Circumference --      Peak Flow --      Pain Score 10/10/20 1857 10     Pain Loc --      Pain Edu? --      Excl. in GC? --    No data found.  Updated Vital Signs BP 133/82 (BP Location: Right Arm)   Pulse 95   Temp 98.6 F (37 C) (Oral)   Resp 18   Ht 5' 5.5" (1.664 m)   Wt 92.1 kg   LMP  (LMP Unknown)   SpO2 99%   BMI 33.27 kg/m   Visual Acuity Right Eye Distance:   Left Eye Distance:   Bilateral Distance:    Right Eye Near:   Left Eye Near:    Bilateral Near:     Physical Exam Vitals and nursing note reviewed.  Constitutional:      General: She is not in acute distress.    Appearance: Normal appearance. She is not ill-appearing, toxic-appearing or diaphoretic.     Comments: She is very uncomfortable throughout the entire history and physical exam.  In fact she needs to leave the exam room on several occasions to go to the bathroom and urinate.  Frequency is quite increased.   HENT:     Head: Normocephalic and atraumatic.     Nose: Nose normal. No congestion.     Mouth/Throat:     Mouth: Mucous membranes are moist.  Eyes:     Conjunctiva/sclera: Conjunctivae normal.     Pupils: Pupils are equal, round, and reactive to light.  Cardiovascular:     Rate and Rhythm: Normal rate and regular rhythm.     Pulses: Normal pulses.  Heart sounds: Normal heart sounds. No murmur heard. No friction rub. No gallop.   Pulmonary:     Effort: Pulmonary effort is normal.     Breath sounds: Normal breath sounds. No stridor. No wheezing, rhonchi or rales.  Abdominal:     General: Bowel sounds are normal. There is no distension.     Palpations: Abdomen is soft. There is no shifting dullness, fluid wave, hepatomegaly or splenomegaly.     Tenderness: There is abdominal tenderness in the suprapubic area. There is no right CVA tenderness, left CVA tenderness, guarding or rebound.  Musculoskeletal:     Cervical back: Normal range of motion and neck supple.  Skin:    General: Skin is warm and dry.     Capillary Refill: Capillary refill takes less than 2 seconds.     Findings: No bruising, erythema, lesion or rash.  Neurological:     General: No focal deficit present.     Mental Status: She is alert and oriented to person, place, and time.  Psychiatric:        Mood and Affect: Mood normal.      UC Treatments / Results  Labs (all labs ordered are listed, but only abnormal results are displayed) Labs Reviewed  URINALYSIS, COMPLETE (UACMP) WITH MICROSCOPIC - Abnormal; Notable for the following components:      Result Value   APPearance TURBID (*)    Specific Gravity, Urine >1.030 (*)    Hgb urine dipstick LARGE (*)    Bilirubin Urine SMALL (*)    Ketones, ur TRACE (*)    Protein, ur >300 (*)    Leukocytes,Ua TRACE (*)    Bacteria, UA MANY (*)    All other components within normal limits  URINE CULTURE    EKG   Radiology No results  found.  Procedures Procedures (including critical care time)  Medications Ordered in UC Medications - No data to display  Initial Impression / Assessment and Plan / UC Course  I have reviewed the triage vital signs and the nursing notes.  Pertinent labs & imaging results that were available during my care of the patient were reviewed by me and considered in my medical decision making (see chart for details).   Clinical impression: Acute onset of UTI symptoms including dysuria, hematuria, increased frequency, increased voiding.  Patient is significantly uncomfortable.  She does not have an acute abdomen.  She has minimal flank pain but her exam is reassuring..  Treatment plan: 1.  The findings and treatment plan were discussed in detail with the patient.  Patient was in agreement. 2.  Recommended getting a UA.  Did show the appearance to be turbid.  She has a large amount of blood.  Trace leukocytes.  Many bacteria.  Greater than 50 WBCs.  She also has a small amount of bilirubin trace ketones with greater than 300 protein.  She is dehydrated with a specific gravity greater than 1.030. 3.  We will send off the culture. 4.  Educational handouts provided. 5.  Plenty of rest, plenty of fluids, Tylenol or Motrin for any fever or discomfort. 6.  Sent in a prescription for Macrobid as well as Pyridium for her dysuria. 7.  If her symptoms worsen she needs to go to the ER.  She does not appear to have kidney stones today without any change. 8.  Patient does not have a primary care provider.  Put in a referral to assist her with. 9.  Patient was discharged from care  in stable condition and will follow-up as needed.    Final Clinical Impressions(s) / UC Diagnoses   Final diagnoses:  Lower urinary tract infectious disease  Urinary frequency  Dysuria  Urinary urgency  Suprapubic abdominal pain  Flank pain     Discharge Instructions     Your urinalysis does show that you have a UTI.  I  sent off the culture. Will empirically treat you with an antibiotic.  I also sent something on for your urinary symptoms.  Please go to the pharmacy and get both of these. Flush your system with plenty of water. You do a blood in your urine so this could be indicative of an ascending infection which would require IV antibiotics and you would need to go to the hospital, or kidney stones which would also require you to go to the hospital.  Just have a low threshold to go to the hospital if you are not improving or you are worsening. Educational handouts provided. I also put in a referral for you to get a primary care doctor since you do not have one.     ED Prescriptions    Medication Sig Dispense Auth. Provider   nitrofurantoin, macrocrystal-monohydrate, (MACROBID) 100 MG capsule Take 1 capsule (100 mg total) by mouth 2 (two) times daily. 10 capsule Delton SeeBarnes, Jeneva Schweizer, MD   phenazopyridine (PYRIDIUM) 200 MG tablet Take 1 tablet (200 mg total) by mouth 3 (three) times daily. 6 tablet Delton SeeBarnes, Mckyle Solanki, MD     PDMP not reviewed this encounter.   Delton SeeBarnes, Sonora Catlin, MD 10/12/20 [phone]

## 2020-10-13 LAB — URINE CULTURE: Culture: >=100000 — AB

## 2020-10-30 ENCOUNTER — Ambulatory Visit: Payer: Self-pay | Admitting: Family Medicine

## 2021-03-29 ENCOUNTER — Other Ambulatory Visit: Payer: Self-pay

## 2021-03-29 ENCOUNTER — Ambulatory Visit
Admission: EM | Admit: 2021-03-29 | Discharge: 2021-03-29 | Disposition: A | Discharge status: Home / Self Care | Payer: Self-pay | Attending: Physician Assistant | Admitting: Physician Assistant

## 2021-03-29 DIAGNOSIS — N898 Other specified noninflammatory disorders of vagina: Secondary | ICD-10-CM | POA: Diagnosis present

## 2021-03-29 DIAGNOSIS — N760 Acute vaginitis: Secondary | ICD-10-CM | POA: Insufficient documentation

## 2021-03-29 DIAGNOSIS — A599 Trichomoniasis, unspecified: Secondary | ICD-10-CM | POA: Diagnosis present

## 2021-03-29 LAB — WET PREP, GENITAL: Sperm: NONE SEEN

## 2021-03-29 LAB — CHLAMYDIA/NGC RT PCR (ARMC ONLY)
Chlamydia Tr: DETECTED — AB
N gonorrhoeae: NOT DETECTED

## 2021-03-29 MED ORDER — FLUCONAZOLE 150 MG PO TABS: ORAL_TABLET | ORAL | 0 refills | Status: DC

## 2021-03-29 MED ORDER — METRONIDAZOLE 500 MG PO TABS: 500.0000 mg | ORAL_TABLET | Freq: Two times a day (BID) | ORAL | 0 refills | Status: AC

## 2021-03-29 NOTE — Discharge Instructions (Signed)
-  You have bacterial vaginosis, yeast infection and trichomonas.  Trichomonas is a sexually transmitted infection.  Take the medications as directed.  No sexual intercourse until 1 week after you and partner have both completed treatment.  We have also tested you for gonorrhea and chlamydia and those results to be back little later today.  We will call you with any positive results and suggest treatment from there.  Results will all be available on MyChart.

## 2021-03-29 NOTE — ED Provider Notes (Signed)
[facility]    CSN: [id number] Arrival date & time: 03/29/21  1047      History   Chief Complaint Chief Complaint  Patient presents with   Vaginal Discharge    HPI Alexis Gilbert is a 50 y.o. female presenting for approximately 2-day history of vaginal burning and slight itching with odor and grayish discharge.  Patient believes she has BV since she has had it in the past.  She does voice some suspicions that her partner may have another partner.  Does not report any red flag signs or symptoms.  No fever, pelvic pain or flank pain.  No urinary symptoms.  No other complaints.  HPI  Past Medical History:  Diagnosis Date   Asthma     Patient Active Problem List   Diagnosis Date Noted   Dysphagia 03/07/2018    Past Surgical History:  Procedure Laterality Date   ABDOMINAL HYSTERECTOMY     hemmorhoidectomy     HERNIA REPAIR     TUBAL LIGATION      OB History   No obstetric history on file.      Home Medications    Prior to Admission medications   Medication Sig Start Date End Date Taking? Authorizing Provider  albuterol (VENTOLIN HFA) 108 (90 Base) MCG/ACT inhaler Inhale 1-2 puffs into the lungs every 6 (six) hours as needed for wheezing or shortness of breath. 09/11/20   Tommie Sams, DO  benzonatate (TESSALON) 100 MG capsule Take 2 capsules (200 mg total) by mouth every 8 (eight) hours. 09/15/20   Becky Augusta, NP  cetirizine-pseudoephedrine (ZYRTEC-D) 5-120 MG tablet Take 1 tablet by mouth 2 (two) times daily. 09/11/20   Tommie Sams, DO  fluconazole (DIFLUCAN) 150 MG tablet Take 1 tab p.o. every 72 hours as needed for yeast infection 03/29/21  Yes Eusebio Friendly B, PA-C  ipratropium (ATROVENT) 0.06 % nasal spray Place 2 sprays into both nostrils 4 (four) times daily. 09/15/20   Becky Augusta, NP  metroNIDAZOLE (FLAGYL) 500 MG tablet Take 1 tablet (500 mg total) by mouth 2 (two) times daily for 7 days. 03/29/21 04/05/21 Yes Shirlee Latch, PA-C   nitrofurantoin, macrocrystal-monohydrate, (MACROBID) 100 MG capsule Take 1 capsule (100 mg total) by mouth 2 (two) times daily. 10/10/20   Delton See, MD  phenazopyridine (PYRIDIUM) 200 MG tablet Take 1 tablet (200 mg total) by mouth 3 (three) times daily. 10/10/20   Delton See, MD  predniSONE (DELTASONE) 50 MG tablet 1 tablet daily x 5 days 09/11/20   Tommie Sams, DO  promethazine-dextromethorphan (PROMETHAZINE-DM) 6.25-15 MG/5ML syrup Take 5 mLs by mouth 4 (four) times daily as needed for cough. 09/11/20   Tommie Sams, DO  sertraline (ZOLOFT) 25 MG tablet Take 1 tablet by mouth 2 (two) times daily. 12/31/19   [provider]  buPROPion (WELLBUTRIN XL) 150 MG 24 hr tablet Take 150 mg by mouth daily.  09/11/20  [provider]  escitalopram (LEXAPRO) 20 MG tablet Take 1 tablet by mouth daily. 12/29/15 03/02/19  [provider]  fluticasone (FLONASE) 50 MCG/ACT nasal spray Place 2 sprays into both nostrils daily. 06/03/18 03/02/19  Janalyn Harder, PA-C  levonorgestrel (MIRENA) 20 MCG/24HR IUD 1 each once by Intrauterine route.  03/02/19  [provider]    Family History No family history on file.  Social History Social History   Tobacco Use   Smoking status: Every Day    Packs/day: 0.30    Types: Cigarettes  Smokeless tobacco: Never  Vaping Use   Vaping Use: Never used  Substance Use Topics   Alcohol use: No   Drug use: Not Currently     Allergies   Codeine and Vicodin [hydrocodone-acetaminophen]   Review of Systems Review of Systems  Constitutional:  Negative for fatigue and fever.  Gastrointestinal:  Negative for abdominal pain, nausea and vomiting.  Genitourinary:  Positive for vaginal discharge. Negative for dysuria, flank pain, frequency, hematuria, urgency, vaginal bleeding and vaginal pain.  Musculoskeletal:  Negative for back pain.  Skin:  Negative for rash.    Physical Exam Triage Vital Signs ED Triage Vitals  Enc Vitals  Group     BP      Pulse      Resp      Temp      Temp src      SpO2      Weight      Height      Head Circumference      Peak Flow      Pain Score      Pain Loc      Pain Edu?      Excl. in [place]?    No data found.  Updated Vital Signs BP 130/89 (BP Location: Left Arm)   Pulse 77   Temp 97.6 F (36.4 C) (Oral)   Resp 16   Ht 5\' 6"  (1.676 m)   Wt 190 lb (86.2 kg)   LMP  (LMP Unknown)   SpO2 98%   BMI 30.67 kg/m      Physical Exam Vitals and nursing note reviewed.  Constitutional:      General: She is not in acute distress.    Appearance: Normal appearance. She is not ill-appearing or toxic-appearing.  HENT:     Head: Normocephalic and atraumatic.  Eyes:     General: No scleral icterus.       Right eye: No discharge.        Left eye: No discharge.     Conjunctiva/sclera: Conjunctivae normal.  Cardiovascular:     Rate and Rhythm: Normal rate and regular rhythm.  Pulmonary:     Effort: Pulmonary effort is normal. No respiratory distress.  Musculoskeletal:     Cervical back: Neck supple.  Skin:    General: Skin is dry.  Neurological:     General: No focal deficit present.     Mental Status: She is alert. Mental status is at baseline.     Motor: No weakness.     Gait: Gait normal.  Psychiatric:        Mood and Affect: Mood normal.        Behavior: Behavior normal.        Thought Content: Thought content normal.     UC Treatments / Results  Labs (all labs ordered are listed, but only abnormal results are displayed) Labs Reviewed  WET PREP, GENITAL - Abnormal; Notable for the following components:      Result Value   Yeast Wet Prep HPF POC PRESENT (*)    Trich, Wet Prep PRESENT (*)    Clue Cells Wet Prep HPF POC PRESENT (*)    WBC, Wet Prep HPF POC MODERATE (*)    All other components within normal limits  CHLAMYDIA/NGC RT PCR [facility] ONLY)              EKG   Radiology No results found.  Procedures Procedures (including critical care  time)  Medications Ordered in UC  Medications - No data to display  Initial Impression / Assessment and Plan / UC Course  I have reviewed the triage vital signs and the nursing notes.  Pertinent labs & imaging results that were available during my care of the patient were reviewed by me and considered in my medical decision making (see chart for details).  50 year old female presenting for vaginal discharge and odor.  Vitals normal and stable.  Patient elects to forego the pelvic exam and perform a self swab.  Wet prep is positive for clue cells, yeast and trichomoniasis.  We will send for GC/chlamydia.  Treating at this time with Flagyl and Diflucan.  I discussed patient's results with her and advised her how to access her GC/chlamydia results.  Advised we will suggest treatment if she is positive.  Reviewed when to return.   Final Clinical Impressions(s) / UC Diagnoses   Final diagnoses:  Acute vaginitis  Vaginal discharge  Trichomonas infection     Discharge Instructions      -You have bacterial vaginosis, yeast infection and trichomonas.  Trichomonas is a sexually transmitted infection.  Take the medications as directed.  No sexual intercourse until 1 week after you and partner have both completed treatment.  We have also tested you for gonorrhea and chlamydia and those results to be back little later today.  We will call you with any positive results and suggest treatment from there.  Results will all be available on MyChart.     ED Prescriptions     Medication Sig Dispense Auth. Provider   metroNIDAZOLE (FLAGYL) 500 MG tablet Take 1 tablet (500 mg total) by mouth 2 (two) times daily for 7 days. 14 tablet Laurene Footman B, PA-C   fluconazole (DIFLUCAN) 150 MG tablet Take 1 tab p.o. every 72 hours as needed for yeast infection 2 tablet Danton Clap, PA-C      PDMP not reviewed this encounter.   Danton Clap, PA-C 03/29/21 1309

## 2021-03-29 NOTE — ED Triage Notes (Signed)
Pt c/o of vaginal burning, slight odor, grayish discharge started 2 days ago

## 2021-03-30 ENCOUNTER — Telehealth (HOSPITAL_COMMUNITY): Payer: Self-pay | Admitting: Emergency Medicine

## 2021-03-30 MED ORDER — DOXYCYCLINE HYCLATE 100 MG PO CAPS: 100.0000 mg | ORAL_CAPSULE | Freq: Two times a day (BID) | ORAL | 0 refills | Status: AC

## 2021-08-01 ENCOUNTER — Other Ambulatory Visit: Payer: Self-pay

## 2021-08-01 ENCOUNTER — Encounter: Payer: Self-pay | Admitting: Emergency Medicine

## 2021-08-01 ENCOUNTER — Ambulatory Visit
Admission: EM | Admit: 2021-08-01 | Discharge: 2021-08-01 | Disposition: A | Discharge status: Home / Self Care | Payer: Self-pay | Attending: Physician Assistant | Admitting: Physician Assistant

## 2021-08-01 DIAGNOSIS — R053 Chronic cough: Secondary | ICD-10-CM | POA: Insufficient documentation

## 2021-08-01 DIAGNOSIS — R300 Dysuria: Secondary | ICD-10-CM | POA: Insufficient documentation

## 2021-08-01 DIAGNOSIS — N3001 Acute cystitis with hematuria: Secondary | ICD-10-CM | POA: Insufficient documentation

## 2021-08-01 LAB — URINALYSIS, ROUTINE W REFLEX MICROSCOPIC
Bilirubin Urine: NEGATIVE
Glucose, UA: NEGATIVE mg/dL
Ketones, ur: NEGATIVE mg/dL
Nitrite: NEGATIVE
Protein, ur: 100 mg/dL — AB
Specific Gravity, Urine: >1.03 — ABNORMAL HIGH (ref 1.005–1.030)
pH: 6 (ref 5.0–8.0)

## 2021-08-01 LAB — URINALYSIS, MICROSCOPIC (REFLEX): WBC, UA: >50 WBC/hpf (ref 0–5)

## 2021-08-01 MED ORDER — BENZONATATE 200 MG PO CAPS: 200.0000 mg | ORAL_CAPSULE | Freq: Three times a day (TID) | ORAL | 0 refills | Status: AC | PRN

## 2021-08-01 MED ORDER — NITROFURANTOIN MONOHYD MACRO 100 MG PO CAPS: 100.0000 mg | ORAL_CAPSULE | Freq: Two times a day (BID) | ORAL | 0 refills | Status: DC

## 2021-08-01 MED ORDER — PHENAZOPYRIDINE HCL 200 MG PO TABS: 200.0000 mg | ORAL_TABLET | Freq: Three times a day (TID) | ORAL | 0 refills | Status: DC

## 2021-08-01 NOTE — ED Provider Notes (Signed)
[facility]    CSN: [id number] Arrival date & time: 08/01/21  1748      History   Chief Complaint Chief Complaint  Patient presents with   Dysuria    HPI Alexis Gilbert is a 51 y.o. female presenting for 3-day history of dysuria, urinary frequency and urgency.  Denies fever, fatigue, flank pain or abdominal pain.  No hematuria.  Reports vaginal discharge without odor and states it is a very small amount.  No concern for STIs.  Believes she has UTI.  Patient also reports chronic cough since she had COVID-19 in August.  Has not been worked up for this.  Has a history of asthma and is also a smoker.  No reports of fever, chest pain or trouble breathing.  HPI  Past Medical History:  Diagnosis Date   Asthma     Patient Active Problem List   Diagnosis Date Noted   Dysphagia 03/07/2018    Past Surgical History:  Procedure Laterality Date   ABDOMINAL HYSTERECTOMY     hemmorhoidectomy     HERNIA REPAIR     TUBAL LIGATION      OB History   No obstetric history on file.      Home Medications    Prior to Admission medications   Medication Sig Start Date End Date Taking? Authorizing Provider  nitrofurantoin, macrocrystal-monohydrate, (MACROBID) 100 MG capsule Take 1 capsule (100 mg total) by mouth 2 (two) times daily. 08/01/21  Yes Shirlee Latch, PA-C  phenazopyridine (PYRIDIUM) 200 MG tablet Take 1 tablet (200 mg total) by mouth 3 (three) times daily. 08/01/21  Yes Shirlee Latch, PA-C  albuterol (VENTOLIN HFA) 108 (90 Base) MCG/ACT inhaler Inhale 1-2 puffs into the lungs every 6 (six) hours as needed for wheezing or shortness of breath. 09/11/20   Tommie Sams, DO  benzonatate (TESSALON) 200 MG capsule Take 1 capsule (200 mg total) by mouth 3 (three) times daily as needed for up to 15 days for cough. 08/01/21 08/16/21  Shirlee Latch, PA-C  cetirizine-pseudoephedrine (ZYRTEC-D) 5-120 MG tablet Take 1 tablet by mouth 2 (two) times daily. 09/11/20   Tommie Sams,  DO  fluconazole (DIFLUCAN) 150 MG tablet Take 1 tab p.o. every 72 hours as needed for yeast infection 03/29/21   Eusebio Friendly B, PA-C  ipratropium (ATROVENT) 0.06 % nasal spray Place 2 sprays into both nostrils 4 (four) times daily. 09/15/20   Becky Augusta, NP  predniSONE (DELTASONE) 50 MG tablet 1 tablet daily x 5 days 09/11/20   Tommie Sams, DO  promethazine-dextromethorphan (PROMETHAZINE-DM) 6.25-15 MG/5ML syrup Take 5 mLs by mouth 4 (four) times daily as needed for cough. 09/11/20   Tommie Sams, DO  sertraline (ZOLOFT) 25 MG tablet Take 1 tablet by mouth 2 (two) times daily. 12/31/19   [provider]  buPROPion (WELLBUTRIN XL) 150 MG 24 hr tablet Take 150 mg by mouth daily.  09/11/20  [provider]  escitalopram (LEXAPRO) 20 MG tablet Take 1 tablet by mouth daily. 12/29/15 03/02/19  [provider]  fluticasone (FLONASE) 50 MCG/ACT nasal spray Place 2 sprays into both nostrils daily. 06/03/18 03/02/19  Janalyn Harder, PA-C  levonorgestrel (MIRENA) 20 MCG/24HR IUD 1 each once by Intrauterine route.  03/02/19  [provider]    Family History No family history on file.  Social History Social History   Tobacco Use   Smoking status: Every Day    Packs/day: 0.30    Types: Cigarettes  Smokeless tobacco: Never  Vaping Use   Vaping Use: Never used  Substance Use Topics   Alcohol use: No   Drug use: Not Currently     Allergies   Codeine and Vicodin [hydrocodone-acetaminophen]   Review of Systems Review of Systems  Constitutional:  Negative for chills, fatigue and fever.  HENT:  Negative for congestion.   Respiratory:  Positive for cough. Negative for shortness of breath.   Cardiovascular:  Negative for chest pain.  Gastrointestinal:  Negative for abdominal pain, diarrhea, nausea and vomiting.  Genitourinary:  Positive for dysuria, frequency and urgency. Negative for decreased urine volume, flank pain, hematuria, pelvic pain, vaginal bleeding,  vaginal discharge and vaginal pain.  Musculoskeletal:  Negative for back pain.  Skin:  Negative for rash.    Physical Exam Triage Vital Signs ED Triage Vitals  Enc Vitals Group     BP 08/01/21 1813 (!) 134/91     Pulse Rate 08/01/21 1813 81     Resp 08/01/21 1813 18     Temp 08/01/21 1813 98.5 F (36.9 C)     Temp Source 08/01/21 1813 Oral     SpO2 08/01/21 1813 100 %     Weight 08/01/21 1811 190 lb 0.6 oz (86.2 kg)     Height 08/01/21 1811  (1.676 m)     Head Circumference --      Peak Flow --      Pain Score 08/01/21 1810 3     Pain Loc --      Pain Edu? --      Excl. in [place]? --    No data found.  Updated Vital Signs BP (!) 134/91 (BP Location: Right Arm)    Pulse 81    Temp 98.5 F (36.9 C) (Oral)    Resp 18    Ht  (1.676 m)    Wt 190 lb 0.6 oz (86.2 kg)    LMP  (LMP Unknown)    SpO2 100%    BMI 30.67 kg/m      Physical Exam Vitals and nursing note reviewed.  Constitutional:      General: She is not in acute distress.    Appearance: Normal appearance. She is not ill-appearing or toxic-appearing.  HENT:     Head: Normocephalic and atraumatic.     Nose: Nose normal.     Mouth/Throat:     Mouth: Mucous membranes are moist.     Pharynx: Oropharynx is clear.  Eyes:     General: No scleral icterus.       Right eye: No discharge.        Left eye: No discharge.     Conjunctiva/sclera: Conjunctivae normal.  Cardiovascular:     Rate and Rhythm: Normal rate and regular rhythm.     Heart sounds: Normal heart sounds.  Pulmonary:     Effort: Pulmonary effort is normal. No respiratory distress.     Breath sounds: Normal breath sounds. No wheezing, rhonchi or rales.  Abdominal:     Palpations: Abdomen is soft.     Tenderness: There is no abdominal tenderness. There is no right CVA tenderness or left CVA tenderness.  Musculoskeletal:     Cervical back: Neck supple.  Skin:    General: Skin is dry.  Neurological:     General: No focal deficit present.      Mental Status: She is alert. Mental status is at baseline.     Motor: No weakness.     Gait:  Gait normal.  Psychiatric:        Mood and Affect: Mood normal.        Behavior: Behavior normal.        Thought Content: Thought content normal.     UC Treatments / Results  Labs (all labs ordered are listed, but only abnormal results are displayed) Labs Reviewed  URINALYSIS, ROUTINE W REFLEX MICROSCOPIC - Abnormal; Notable for the following components:      Result Value   APPearance CLOUDY (*)    Specific Gravity, Urine >1.030 (*)    Hgb urine dipstick MODERATE (*)    Protein, ur 100 (*)    Leukocytes,Ua MODERATE (*)    All other components within normal limits  URINALYSIS, MICROSCOPIC (REFLEX) - Abnormal; Notable for the following components:   Bacteria, UA MANY (*)    All other components within normal limits  URINE CULTURE    EKG   Radiology No results found.  Procedures Procedures (including critical care time)  Medications Ordered in UC Medications - No data to display  Initial Impression / Assessment and Plan / UC Course  I have reviewed the triage vital signs and the nursing notes.  Pertinent labs & imaging results that were available during my care of the patient were reviewed by me and considered in my medical decision making (see chart for details).  51 year old female presenting for urinary frequency, urgency and dysuria.  UA shows greater than 1.030 specific gravity, moderate hemoglobin and moderate leukocytes.  Also many bacteria.  We will send urine for culture.  Treating patient for UTI.  Sent to [facility].  At discharge, patient requests a prescription for [facility] for chronic cough.  Patient did not mention this during my visit with her until I was getting ready to discharge her.  She is a smoker and has asthma.  Patient reports having cough that is dry since she had COVID-19 in August 2022.  Has not been worked up for this.  I discussed  with her that this will require a full visit and further work-up.  Advised she may need imaging, pulmonary function testing and may be referral to pulmonologist.  Also discussed other causes such as allergies and GERD.  Patient will make a follow-up appointment with her PCP but return if she is unable to see them anytime sooner if symptoms worsen.   Final Clinical Impressions(s) / UC Diagnoses   Final diagnoses:  Acute cystitis with hematuria  Dysuria  Chronic cough     Discharge Instructions      UTI: Based on either symptoms or urinalysis, you may have a urinary tract infection. We will send the urine for culture and call with results in a few days. Begin antibiotics at this time. Your symptoms should be much improved over the next 2-3 days. Increase rest and fluid intake. If for some reason symptoms are worsening or not improving after a couple of days or the urine culture determines the antibiotics you are taking will not treat the infection, the antibiotics may be changed. Return or go to ER for fever, back pain, worsening urinary pain, discharge, increased blood in urine. May take Tylenol or Motrin OTC for pain relief or consider AZO if no contraindications   I have sent Tessalon Perles at your request for the cough but that is a condition that we will need further work-up since it is chronic.  Make an appoint with your PCP as you can have imaging and further work-up including  possible referral to pulmonologist or pulmonary function testing.  If symptoms worsen before then, return or go to ER.     ED Prescriptions     Medication Sig Dispense Auth. Provider   benzonatate (TESSALON) 200 MG capsule Take 1 capsule (200 mg total) by mouth 3 (three) times daily as needed for up to 15 days for cough. 45 capsule Eusebio FriendlyEaves, Bret Stamour B, PA-C   nitrofurantoin, macrocrystal-monohydrate, (MACROBID) 100 MG capsule Take 1 capsule (100 mg total) by mouth 2 (two) times daily. 10 capsule Eusebio FriendlyEaves, Stephaie Dardis B,  PA-C   phenazopyridine (PYRIDIUM) 200 MG tablet Take 1 tablet (200 mg total) by mouth 3 (three) times daily. 6 tablet Gareth MorganEaves, Shamell Hittle B, PA-C      PDMP not reviewed this encounter.   Shirlee Latchaves, Jassiel Flye B, PA-C 08/01/21 [phone]

## 2021-08-01 NOTE — ED Triage Notes (Signed)
Pt c/o dysuria, urinary frequency. Started about 3 days ago. Denies fever, lower back pain or pelvic pain.  ?

## 2021-08-01 NOTE — Discharge Instructions (Signed)
UTI: Based on either symptoms or urinalysis, you may have a urinary tract infection. We will send the urine for culture and call with results in a few days. Begin antibiotics at this time. Your symptoms should be much improved over the next 2-3 days. Increase rest and fluid intake. If for some reason symptoms are worsening or not improving after a couple of days or the urine culture determines the antibiotics you are taking will not treat the infection, the antibiotics may be changed. Return or go to ER for fever, back pain, worsening urinary pain, discharge, increased blood in urine. May take Tylenol or Motrin OTC for pain relief or consider AZO if no contraindications  ? ?I have sent Tessalon Perles at your request for the cough but that is a condition that we will need further work-up since it is chronic.  Make an appoint with your PCP as you can have imaging and further work-up including possible referral to pulmonologist or pulmonary function testing.  If symptoms worsen before then, return or go to ER. ?

## 2021-08-02 ENCOUNTER — Telehealth: Payer: Self-pay | Admitting: Family Medicine

## 2021-08-02 DIAGNOSIS — R319 Hematuria, unspecified: Secondary | ICD-10-CM

## 2021-08-02 NOTE — Progress Notes (Signed)
[place]  ? ?Pt was seen in ED yesterday on 3/8 and reports bleeding in urine seems more, and asked if going to ED is needed. Advised from D/c info that is bleeding has increased she needs to go be seen.  ? ?She is also awaiting results of urine culture due to UTI.  ? ?Patient acknowledged agreement and understanding of the plan.  ? ?

## 2021-08-03 LAB — URINE CULTURE: Culture: 60000 — AB

## 2021-08-04 ENCOUNTER — Other Ambulatory Visit: Payer: Self-pay

## 2021-08-04 ENCOUNTER — Ambulatory Visit
Admission: EM | Admit: 2021-08-04 | Discharge: 2021-08-04 | Disposition: A | Discharge status: Home / Self Care | Payer: Self-pay | Attending: Student | Admitting: Student

## 2021-08-04 DIAGNOSIS — N760 Acute vaginitis: Secondary | ICD-10-CM | POA: Insufficient documentation

## 2021-08-04 DIAGNOSIS — B9689 Other specified bacterial agents as the cause of diseases classified elsewhere: Secondary | ICD-10-CM | POA: Insufficient documentation

## 2021-08-04 DIAGNOSIS — B3731 Acute candidiasis of vulva and vagina: Secondary | ICD-10-CM | POA: Insufficient documentation

## 2021-08-04 DIAGNOSIS — Z113 Encounter for screening for infections with a predominantly sexual mode of transmission: Secondary | ICD-10-CM | POA: Insufficient documentation

## 2021-08-04 LAB — WET PREP, GENITAL
Sperm: NONE SEEN
Trich, Wet Prep: NONE SEEN
WBC, Wet Prep HPF POC: >10 — AB (ref <10)

## 2021-08-04 MED ORDER — METRONIDAZOLE 500 MG PO TABS: 500.0000 mg | ORAL_TABLET | Freq: Two times a day (BID) | ORAL | 0 refills | Status: DC

## 2021-08-04 MED ORDER — FLUCONAZOLE 200 MG PO TABS: 200.0000 mg | ORAL_TABLET | Freq: Every day | ORAL | 1 refills | Status: AC

## 2021-08-04 NOTE — ED Provider Notes (Signed)
[facility]    CSN: [id number] Arrival date & time: 08/04/21  0827      History   Chief Complaint Chief Complaint  Patient presents with   SEXUALLY TRANSMITTED DISEASE    HPI Alexis Gilbert is a 51 y.o. female.   HPI  51 year old female here requesting STI check.  The patient was evaluated in this urgent care 3 days ago for complaints of painful urination, urinary urgency and frequency and was diagnosed with acute cystitis and placed on Macrobid.  Alysis at that time showed moderate hemoglobin, moderate leukocyte esterase, and 100 protein.  Many bacteria and greater than 50 WBCs.  Her urine culture grew out diphtheroids and she was advised to stop her Macrobid.  She is continuing to have dysuria, urine urgency frequency, and now is developed hematuria.  She also endorses a vaginal discharge and has started to develop suprapubic pain this morning.  She has not had a fever, low back pain, or vaginal itching.  She did encounter her significant other at another person's residence which is why she is requesting STI testing.  Past Medical History:  Diagnosis Date   Asthma     Patient Active Problem List   Diagnosis Date Noted   Dysphagia 03/07/2018    Past Surgical History:  Procedure Laterality Date   ABDOMINAL HYSTERECTOMY     hemmorhoidectomy     HERNIA REPAIR     TUBAL LIGATION      OB History   No obstetric history on file.      Home Medications    Prior to Admission medications   Medication Sig Start Date End Date Taking? Authorizing Provider  albuterol (VENTOLIN HFA) 108 (90 Base) MCG/ACT inhaler Inhale 1-2 puffs into the lungs every 6 (six) hours as needed for wheezing or shortness of breath. 09/11/20  Yes Cook, Jayce G, DO  benzonatate (TESSALON) 200 MG capsule Take 1 capsule (200 mg total) by mouth 3 (three) times daily as needed for up to 15 days for cough. 08/01/21 08/16/21 Yes Shirlee LatchEaves, Lesley B, PA-C  cetirizine-pseudoephedrine (ZYRTEC-D) 5-120  MG tablet Take 1 tablet by mouth 2 (two) times daily. 09/11/20  Yes Cook, Jayce G, DO  fluconazole (DIFLUCAN) 200 MG tablet Take 1 tablet (200 mg total) by mouth daily for 2 doses. 08/04/21 08/06/21 Yes Becky Augustayan, Prudencio Velazco, NP  ipratropium (ATROVENT) 0.06 % nasal spray Place 2 sprays into both nostrils 4 (four) times daily. 09/15/20  Yes Becky Augustayan, Ulas Zuercher, NP  metroNIDAZOLE (FLAGYL) 500 MG tablet Take 1 tablet (500 mg total) by mouth 2 (two) times daily. 08/04/21  Yes Becky Augustayan, Eutha Cude, NP  nitrofurantoin, macrocrystal-monohydrate, (MACROBID) 100 MG capsule Take 1 capsule (100 mg total) by mouth 2 (two) times daily. 08/01/21  Yes Shirlee LatchEaves, Lesley B, PA-C  phenazopyridine (PYRIDIUM) 200 MG tablet Take 1 tablet (200 mg total) by mouth 3 (three) times daily. 08/01/21  Yes Shirlee LatchEaves, Lesley B, PA-C  predniSONE (DELTASONE) 50 MG tablet 1 tablet daily x 5 days 09/11/20  Yes Cook, Jayce G, DO  promethazine-dextromethorphan (PROMETHAZINE-DM) 6.25-15 MG/5ML syrup Take 5 mLs by mouth 4 (four) times daily as needed for cough. 09/11/20  Yes Cook, Jayce G, DO  sertraline (ZOLOFT) 25 MG tablet Take 1 tablet by mouth 2 (two) times daily. 12/31/19  Yes [provider]  buPROPion (WELLBUTRIN XL) 150 MG 24 hr tablet Take 150 mg by mouth daily.  09/11/20  [provider]  escitalopram (LEXAPRO) 20 MG tablet Take 1 tablet by mouth daily. 12/29/15 03/02/19  [provider]  fluticasone (FLONASE) 50 MCG/ACT nasal spray Place 2 sprays into both nostrils daily. 06/03/18 03/02/19  Janalyn Harder, PA-C  levonorgestrel (MIRENA) 20 MCG/24HR IUD 1 each once by Intrauterine route.  03/02/19  [provider]    Family History History reviewed. No pertinent family history.  Social History Social History   Tobacco Use   Smoking status: Every Day    Packs/day: 0.30    Types: Cigarettes   Smokeless tobacco: Never  Vaping Use   Vaping Use: Never used  Substance Use Topics   Alcohol use: No   Drug use: Not Currently      Allergies   Codeine and Vicodin [hydrocodone-acetaminophen]   Review of Systems Review of Systems  Constitutional:  Negative for fever.  Gastrointestinal:  Positive for abdominal pain.  Genitourinary:  Positive for dysuria, frequency, hematuria, urgency and vaginal discharge. Negative for vaginal bleeding and vaginal pain.  Musculoskeletal:  Negative for back pain.    Physical Exam Triage Vital Signs ED Triage Vitals  Enc Vitals Group     BP 08/04/21 0838 (!) 126/96     Pulse Rate 08/04/21 0838 86     Resp 08/04/21 0838 18     Temp 08/04/21 0838 98.7 F (37.1 C)     Temp Source 08/04/21 0838 Oral     SpO2 08/04/21 0838 100 %     Weight 08/04/21 0836 190 lb 0.6 oz (86.2 kg)     Height 08/04/21 0836 5\' 6"  (1.676 m)     Head Circumference --      Peak Flow --      Pain Score 08/04/21 0838 0     Pain Loc --      Pain Edu? --      Excl. in GC? --    No data found.  Updated Vital Signs BP (!) 126/96 (BP Location: Left Arm)    Pulse 86    Temp 98.7 F (37.1 C) (Oral)    Resp 18    Ht 5\' 6"  (1.676 m)    Wt 190 lb 0.6 oz (86.2 kg)    LMP  (LMP Unknown)    SpO2 100%    BMI 30.67 kg/m   Visual Acuity Right Eye Distance:   Left Eye Distance:   Bilateral Distance:    Right Eye Near:   Left Eye Near:    Bilateral Near:     Physical Exam Vitals and nursing note reviewed.  Constitutional:      Appearance: Normal appearance. She is not ill-appearing.  HENT:     Head: Normocephalic and atraumatic.  Cardiovascular:     Rate and Rhythm: Normal rate and regular rhythm.     Pulses: Normal pulses.     Heart sounds: Normal heart sounds. No murmur heard.   No friction rub. No gallop.  Pulmonary:     Effort: Pulmonary effort is normal.     Breath sounds: Normal breath sounds. No wheezing, rhonchi or rales.  Abdominal:     General: Abdomen is flat.     Palpations: Abdomen is soft.     Tenderness: There is abdominal tenderness. There is no right CVA tenderness, left CVA  tenderness, guarding or rebound.  Skin:    General: Skin is warm and dry.     Capillary Refill: Capillary refill takes less than 2 seconds.     Findings: No erythema or rash.  Neurological:     General: No focal deficit present.  Mental Status: She is alert and oriented to person, place, and time.  Psychiatric:        Mood and Affect: Mood normal.        Behavior: Behavior normal.        Thought Content: Thought content normal.        Judgment: Judgment normal.     UC Treatments / Results  Labs (all labs ordered are listed, but only abnormal results are displayed) Labs Reviewed  WET PREP, GENITAL - Abnormal; Notable for the following components:      Result Value   Yeast Wet Prep HPF POC PRESENT (*)    Clue Cells Wet Prep HPF POC PRESENT (*)    WBC, Wet Prep HPF POC >10 (*)    All other components within normal limits  CERVICOVAGINAL ANCILLARY ONLY    EKG   Radiology No results found.  Procedures Procedures (including critical care time)  Medications Ordered in UC Medications - No data to display  Initial Impression / Assessment and Plan / UC Course  I have reviewed the triage vital signs and the nursing notes.  Pertinent labs & imaging results that were available during my care of the patient were reviewed by me and considered in my medical decision making (see chart for details).  Patient is a very pleasant, nontoxic-appearing 51 year old female here for evaluation of continued genitourinary symptoms as outlined in HPI above.  Her physical exam reveals a benign cardiopulmonary exam with clear lung sounds in all fields.  No CVA tenderness on exam.  Abdomen is soft with mild suprapubic tenderness but no guarding or rebound.  As mentioned in HPI above, urine culture grew out diphtheroids and patient was advised to stop the Macrobid.  Upon reviewing the literature diphtheroids are a colonizing species in the perineum and abdomen but there is also indication for acute  cystitis and possible pyelonephritis development as a result of infection.  Given the fact the patient's symptoms have worsened I have advised her to restart the Macrobid.  Gonorrhea and Chlamydia testing and wet prep were collected at triage as well.  The wet prep is positive for both clue cells and yeast.  I will start the patient on metronidazole twice daily for 7 days for treatment of the BV and prescribed Diflucan for treatment of the yeast infection.  She has elected to wait for the results of the gonorrhea and Chlamydia testing rather than be treated empirically at this time.   Final Clinical Impressions(s) / UC Diagnoses   Final diagnoses:  BV (bacterial vaginosis)  Vaginal yeast infection  Routine screening for STI (sexually transmitted infection)     Discharge Instructions      Take the Flagyl (metronidazole) 500 mg twice daily for treatment of your bacterial vaginosis.  Avoid alcohol while on the metronidazole as taken together will cause of vomiting.  Bacterial vaginosis is often caused by a imbalance of bacteria in your vaginal vault.  This is sometimes a result of using tampons or hormonal fluctuations during her menstrual cycle.  You if your symptoms are recurrent you can try using a boric acid suppository twice weekly to help maintain the acid-base balance in your vagina vault which could prevent further infection.  You can also try vaginal probiotics to help return normal bacterial balance.   Take 1 Diflucan tablet now and repeat in 7 days if you still have symptoms.  If your GC and chlamydia tests return positive we will treat you at that time.  ED Prescriptions     Medication Sig Dispense Auth. Provider   metroNIDAZOLE (FLAGYL) 500 MG tablet Take 1 tablet (500 mg total) by mouth 2 (two) times daily. 14 tablet Becky Augusta, NP   fluconazole (DIFLUCAN) 200 MG tablet Take 1 tablet (200 mg total) by mouth daily for 2 doses. 2 tablet Becky Augusta, NP       PDMP not reviewed this encounter.   Becky Augusta, NP 08/04/21 [phone]

## 2021-08-04 NOTE — ED Triage Notes (Signed)
Pt here wanting STD check. Not sure if her partner is being faithful. Was on Macrobid but was told to stop it yesterday because her culture didn't show any signs of UTI.  ?

## 2021-08-04 NOTE — Discharge Instructions (Signed)
Take the Flagyl (metronidazole) 500 mg twice daily for treatment of your bacterial vaginosis. ? ?Avoid alcohol while on the metronidazole as taken together will cause of vomiting. ? ?Bacterial vaginosis is often caused by a imbalance of bacteria in your vaginal vault.  This is sometimes a result of using tampons or hormonal fluctuations during her menstrual cycle. ? ?You if your symptoms are recurrent you can try using a boric acid suppository twice weekly to help maintain the acid-base balance in your vagina vault which could prevent further infection. ? ?You can also try vaginal probiotics to help return normal bacterial balance.  ? ?Take 1 Diflucan tablet now and repeat in 7 days if you still have symptoms. ? ?If your GC and chlamydia tests return positive we will treat you at that time.  ?

## 2021-08-05 ENCOUNTER — Telehealth: Payer: Self-pay | Admitting: Emergency Medicine

## 2021-08-05 MED ORDER — PHENAZOPYRIDINE HCL 200 MG PO TABS: 200.0000 mg | ORAL_TABLET | Freq: Three times a day (TID) | ORAL | 0 refills | Status: DC

## 2021-08-05 NOTE — Telephone Encounter (Signed)
Patient requesting medication to help her with urinary urgency.  We will send a prescription for Pyridium to the pharmacy. ?

## 2021-08-06 LAB — CERVICOVAGINAL ANCILLARY ONLY
Chlamydia: POSITIVE — AB
Comment: NEGATIVE
Comment: NORMAL
Neisseria Gonorrhea: POSITIVE — AB

## 2021-08-07 ENCOUNTER — Telehealth (HOSPITAL_COMMUNITY): Payer: Self-pay | Admitting: Emergency Medicine

## 2021-08-07 MED ORDER — DOXYCYCLINE HYCLATE 100 MG PO CAPS: 100.0000 mg | ORAL_CAPSULE | Freq: Two times a day (BID) | ORAL | 0 refills | Status: AC

## 2021-08-07 NOTE — Telephone Encounter (Signed)
Per protocol, patient will need treatment with IM Rocephin 500mg for positive Gonorrhea.  Will also need treatment with Doxycycline.   Contacted patient by phone.  Verified identity using two identifiers.  Provided positive result.  Reviewed safe sex practices, notifying partners, and refraining from sexual activities for 7 days from time of treatment.  Patient verified understanding, all questions answered.   HHS notified Verified pharmacy, prescription sent 

## 2021-08-08 ENCOUNTER — Ambulatory Visit
Admission: EM | Admit: 2021-08-08 | Discharge: 2021-08-08 | Disposition: A | Discharge status: Home / Self Care | Payer: Self-pay | Attending: Emergency Medicine | Admitting: Emergency Medicine

## 2021-08-08 ENCOUNTER — Other Ambulatory Visit: Payer: Self-pay

## 2021-08-08 DIAGNOSIS — A549 Gonococcal infection, unspecified: Secondary | ICD-10-CM

## 2021-08-08 MED ORDER — CEFTRIAXONE SODIUM 500 MG IJ SOLR
500.0000 mg | Freq: Once | INTRAMUSCULAR | Status: AC
  Administered 2021-08-08: 500 mg via INTRAMUSCULAR

## 2021-08-08 NOTE — ED Triage Notes (Signed)
Patient presents to [facility] for STD treatment. She spoke with [facility] about positive test results instructing her to come in for Rocephin injection. Medication was administered. Pt monitored; tolerated well. Edu provided. Pt voiced understanding.  ?

## 2021-10-17 ENCOUNTER — Ambulatory Visit
Admission: EM | Admit: 2021-10-17 | Discharge: 2021-10-17 | Disposition: A | Discharge status: Home / Self Care | Payer: Self-pay | Attending: Emergency Medicine | Admitting: Emergency Medicine

## 2021-10-17 ENCOUNTER — Encounter: Payer: Self-pay | Admitting: Emergency Medicine

## 2021-10-17 ENCOUNTER — Other Ambulatory Visit: Payer: Self-pay

## 2021-10-17 ENCOUNTER — Ambulatory Visit (INDEPENDENT_AMBULATORY_CARE_PROVIDER_SITE_OTHER): Payer: Self-pay

## 2021-10-17 DIAGNOSIS — H9201 Otalgia, right ear: Secondary | ICD-10-CM

## 2021-10-17 DIAGNOSIS — R053 Chronic cough: Secondary | ICD-10-CM

## 2021-10-17 DIAGNOSIS — J069 Acute upper respiratory infection, unspecified: Secondary | ICD-10-CM

## 2021-10-17 MED ORDER — ALBUTEROL SULFATE HFA 108 (90 BASE) MCG/ACT IN AERS: 1.0000 | INHALATION_SPRAY | RESPIRATORY_TRACT | 0 refills | Status: DC | PRN

## 2021-10-17 MED ORDER — FLUTICASONE PROPIONATE 50 MCG/ACT NA SUSP: 2.0000 | Freq: Every day | NASAL | 0 refills | Status: DC

## 2021-10-17 MED ORDER — AEROCHAMBER MV MISC: 1 refills | Status: DC

## 2021-10-17 MED ORDER — CYCLOBENZAPRINE HCL 10 MG PO TABS: 10.0000 mg | ORAL_TABLET | Freq: Every day | ORAL | 0 refills | Status: DC

## 2021-10-17 NOTE — Discharge Instructions (Addendum)
Your chest x-ray was normal.  I suspect your cough could be coming from a number of things, such as postnasal drip, acid reflux, asthma.  2 puffs from your albuterol inhaler using your spacer every 4 hours for 2 days, then every 6 hours for 2 days, then as needed.  Mucinex D, Flonase, saline nasal irrigation with a NeilMed sinus rinse and distilled water as often as you want for the nasal congestion.  I suspect your ear pain is coming either from eustachian tube dysfunction due to the upper respiratory infection or swelling of the ear or TMJ.  Take the Naprosyn with 1000 mg of Tylenol twice a day, and try the Flexeril at night.  Soft diet for the next several days.  Here is a list of primary care providers who are taking new patients:  [facility] [place] Dr. Joseph Berkshire (sports medicine) Dr. Elizabeth Sauer [address] [place] [address] [phone]  [facility] at [facility] [address] [place], [place] [address] [phone]  [facility] [place] [address]  [place] [address]  [phone]  [facility] [address] [place], [place] [address] [phone]  [facility] [address] [place]  [phone] [place], [place] [address]  Here are clinics/ other resources who will see you if you do not have insurance. Some have certain criteria that you must meet. Call them and find out what they are:  [facility]: [address] [place], [place] [address] Phone: [phone] Hours: First and Third Saturdays of each Month, 9 a.m. - 1 p.m.  [facility]: [address] [place], [place] [address] Phone: [phone] Hours: Tuesday, 4 p.m. - 8 p.m. Thursday, 1 p.m. - 8 p.m. Wednesday, 9 a.m. - [facility] [address] [place], [place] [address] Phone: [phone] Pharmacy Phone Number: [phone] Dental Phone Number: [phone] [facility] Insurance Help: [phone]  Dental  Hours: Monday - Thursday, 8 a.m. - 6 p.m.  [facility] [address] [place], [place] [address] Phone: [phone] Pharmacy Phone Number: [phone] [facility] Insurance Help: [phone]  [facility] [address] [place] [place], [place] [address] Phone: [phone] Pharmacy Phone Number: [phone] [facility] Insurance Help: [phone]  [facility] [address] [place], [place] [address] Phone: [phone] [facility] Insurance Help: [phone]   [facility] [address] [place], [place] [address] Phone: [phone]  Go to www.goodrx.com  or www.costplusdrugs.com to look up your medications. This will give you a list of where you can find your prescriptions at the most affordable prices. Or ask the pharmacist what the cash price is, or if they have any other discount programs available to help make your medication more affordable. This can be less expensive than what you would pay with insurance.

## 2021-10-17 NOTE — ED Provider Notes (Signed)
HPI  SUBJECTIVE:  Alexis Gilbert is a 51 y.o. female who presents with 3 days of right ear pain, tender cervical right-sided lymphadenopathy, nasal congestion, clear mucoid rhinorrhea, states that her right ear feels full.  She reports body aches, headaches, postnasal drip.  No fevers, otorrhea, change in hearing, sore throat.  No foreign body insertion into the ear.  She does not grind her teeth.  No recent swimming.  No allergy symptoms.  She also reports cough for the past 9 months since having COVID.  It has not changed.   She got 2 doses of the COVID-vaccine in the last years flu vaccine.  She has tried Afrin with improvement in the nasal congestion.  No aggravating factors. She is a smoker and has a history of asthma.  No history of TMJ arthralgia PCP: None    Past Medical History:  Diagnosis Date   Asthma     Past Surgical History:  Procedure Laterality Date   ABDOMINAL HYSTERECTOMY     hemmorhoidectomy     HERNIA REPAIR     TUBAL LIGATION      History reviewed. No pertinent family history.  Social History   Tobacco Use   Smoking status: Every Day    Packs/day: 0.30    Types: Cigarettes   Smokeless tobacco: Never  Vaping Use   Vaping Use: Never used  Substance Use Topics   Alcohol use: No   Drug use: Not Currently    No current facility-administered medications for this encounter.  Current Outpatient Medications:    albuterol (VENTOLIN HFA) 108 (90 Base) MCG/ACT inhaler, Inhale 1-2 puffs into the lungs every 4 (four) hours as needed for wheezing or shortness of breath., Disp: 1 each, Rfl: 0   cyclobenzaprine (FLEXERIL) 10 MG tablet, Take 1 tablet (10 mg total) by mouth at bedtime., Disp: 20 tablet, Rfl: 0   fluticasone (FLONASE) 50 MCG/ACT nasal spray, Place 2 sprays into both nostrils daily., Disp: 16 g, Rfl: 0   phenylephrine (NEO-SYNEPHRINE) 0.25 % nasal spray, Place 1 spray into both nostrils every 6 (six) hours as needed for congestion., Disp: , Rfl:     Spacer/Aero-Holding Chambers (AEROCHAMBER MV) inhaler, Use as instructed, Disp: 1 each, Rfl: 1   sertraline (ZOLOFT) 25 MG tablet, Take 1 tablet by mouth 2 (two) times daily., Disp: , Rfl:   Allergies  Allergen Reactions   Codeine Anaphylaxis   Hydrocodone-Acetaminophen Swelling   Vicodin [Hydrocodone-Acetaminophen] Rash     ROS  As noted in HPI.   Physical Exam  BP 121/84   Pulse 72   Temp 97.9 F (36.6 C)   Resp 18   LMP  (LMP Unknown)   SpO2 100%    Constitutional: Well developed, well nourished, no acute distress Eyes:  EOMI, conjunctiva normal bilaterally HENT: Normocephalic, atraumatic,mucus membranes moist.  Right EAC, external ear, TMs normal bilaterally.  No pain with traction on pinna, palpation of tragus or mastoid right ear.  Positive mild tenderness of the left TMJ.  No crepitus.  Decreased hearing right ear compared to left.  Positive nasal congestion.  No maxillary sinus tenderness, normal tonsils without exudates.  No obvious postnasal drip.  No submandibular gland swelling Neck: Bilateral cervical lymphadenopathy on the right more than left. Respiratory: Normal inspiratory effort, lungs clear bilaterally.  No anterior, lateral chest wall tenderness Cardiovascular: Normal rate, regular rhythm GI: nondistended skin: No rash, skin intact Musculoskeletal: no deformities Neurologic: Alert & oriented x 3, no focal neuro deficits Psychiatric: Speech and  behavior appropriate   ED Course   Medications - No data to display  Orders Placed This Encounter  Procedures   DG Chest 2 View    Standing Status:   Standing    Number of Occurrences:   1    Order Specific Question:   Reason for Exam (SYMPTOM  OR DIAGNOSIS REQUIRED)    Answer:   Cough 9 months, smoker.  Rule out mass, malignancy    No results found for this or any previous visit (from the past 24 hour(s)). DG Chest 2 View  Result Date: 10/17/2021 CLINICAL DATA:  Chronic cough. Cough for 9 months,  smoker. Rule out mass, malignancy. EXAM: CHEST - 2 VIEW COMPARISON:  Chest radiograph 02/07/2020 and earlier. FINDINGS: Heart size within normal limits. No appreciable airspace consolidation. No evidence of pleural effusion or pneumothorax. No acute bony abnormality identified. IMPRESSION: No evidence of acute cardiopulmonary abnormality. Electronically Signed   By: Jackey Loge D.O.   On: 10/17/2021 09:15    ED Clinical Impression  1. Acute upper respiratory infection   2. Chronic cough   3. Otalgia of right ear      ED Assessment/Plan  1.  Otalgia.  Concern for viral URI.  Otalgia could be eustachian tube dysfunction due to URI versus TMJ arthralgia.  She does have tenderness over the TMJ, which could be causing her otalgia. , Flonase, Mucinex D, saline nasal irrigation for the nasal congestion.  Also Tylenol/Naprosyn and Flexeril for TMJ.  2.  Cough.  This has not been investigated over the past 9 months as patient does not have a PCP nor check chest x-ray due to history of smoking.  Home with an albuterol inhaler with a spacer.  Reviewed imaging independently.  Normal chest x-ray.  See radiology report for full details.  We will provide primary care list and order assistance in finding a PCP. Discussed  imaging, MDM, treatment plan, and plan for follow-up with patient. patient agrees with plan.   Meds ordered this encounter  Medications   fluticasone (FLONASE) 50 MCG/ACT nasal spray    Sig: Place 2 sprays into both nostrils daily.    Dispense:  16 g    Refill:  0   cyclobenzaprine (FLEXERIL) 10 MG tablet    Sig: Take 1 tablet (10 mg total) by mouth at bedtime.    Dispense:  20 tablet    Refill:  0   albuterol (VENTOLIN HFA) 108 (90 Base) MCG/ACT inhaler    Sig: Inhale 1-2 puffs into the lungs every 4 (four) hours as needed for wheezing or shortness of breath.    Dispense:  1 each    Refill:  0   Spacer/Aero-Holding Chambers (AEROCHAMBER MV) inhaler    Sig: Use as instructed     Dispense:  1 each    Refill:  1      *This clinic note was created using Dragon dictation software. Therefore, there may be occasional mistakes despite careful proofreading.  ?    Domenick Gong, MD 10/19/21 [phone]

## 2021-10-17 NOTE — ED Triage Notes (Signed)
Patient c/o RT cervical lymph node swelling x 1 day.   Patient c/o  RT sided ear pain x 2 days.   Patient endorses onset of symptoms began with generalized body aches and fever that has now resolved.   Patient endorses nasal congestion.   Patient denies hearing changes to RT ear.   Patient has used Afrin nose spray with no relief of symptoms.

## 2022-02-24 ENCOUNTER — Ambulatory Visit
Admission: EM | Admit: 2022-02-24 | Discharge: 2022-02-24 | Disposition: A | Discharge status: Home / Self Care | Payer: Self-pay | Attending: Emergency Medicine | Admitting: Emergency Medicine

## 2022-02-24 ENCOUNTER — Encounter: Payer: Self-pay | Admitting: Emergency Medicine

## 2022-02-24 DIAGNOSIS — Z20822 Contact with and (suspected) exposure to covid-19: Secondary | ICD-10-CM

## 2022-02-24 DIAGNOSIS — B349 Viral infection, unspecified: Secondary | ICD-10-CM

## 2022-02-24 DIAGNOSIS — M26623 Arthralgia of bilateral temporomandibular joint: Secondary | ICD-10-CM

## 2022-02-24 DIAGNOSIS — H9203 Otalgia, bilateral: Secondary | ICD-10-CM

## 2022-02-24 LAB — RESP PANEL BY RT-PCR (FLU A&B, COVID) ARPGX2
Influenza A by PCR: NEGATIVE
Influenza B by PCR: NEGATIVE
SARS Coronavirus 2 by RT PCR: NEGATIVE

## 2022-02-24 MED ORDER — IBUPROFEN 600 MG PO TABS: 600.0000 mg | ORAL_TABLET | Freq: Four times a day (QID) | ORAL | 0 refills | Status: DC | PRN

## 2022-02-24 MED ORDER — PROMETHAZINE-DM 6.25-15 MG/5ML PO SYRP: 5.0000 mL | ORAL_SOLUTION | Freq: Four times a day (QID) | ORAL | 0 refills | Status: DC | PRN

## 2022-02-24 MED ORDER — CYCLOBENZAPRINE HCL 10 MG PO TABS: 10.0000 mg | ORAL_TABLET | Freq: Every day | ORAL | 0 refills | Status: DC

## 2022-02-24 MED ORDER — IPRATROPIUM BROMIDE 0.06 % NA SOLN: 2.0000 | Freq: Four times a day (QID) | NASAL | 0 refills | Status: DC

## 2022-02-24 NOTE — ED Triage Notes (Signed)
Received flu shot last week on Tuesday  Started to feel bad on Thursday.  Friday body aches.  Headache, bilateral ear pain, body aches, chills, no appetite, cough and congestion.

## 2022-02-24 NOTE — Discharge Instructions (Addendum)
Take 600 mg of ibuprofen combined with 1000 mg of Tylenol 3-4 times a day as needed for pain.  Promethazine DM for cough, Atrovent nasal spray, saline nasal irrigation with a NeilMed sinus rinse and distilled water as often as you want for nasal congestion, postnasal drip.  Try the albuterol inhaler with a spacer previously prescribed to you as needed for shortness of breath.  Take the Flexeril at night to help prevent you from grinding your teeth or clenching her jaw in your sleep.  Soft diet for the next few days.  We will contact you if you have COVID or influenza come back positive.

## 2022-02-24 NOTE — ED Provider Notes (Signed)
HPI  SUBJECTIVE:  Alexis Gilbert is a 51 y.o. female who presents with body aches, bilateral ear pain, nasal congestion, sore throat, postnasal drip, headache, dry cough, mild shortness of breath starting 4 days after getting a flu shot.  No change in hearing, otorrhea, rhinorrhea, fevers, sinus pain or pressure, loss of sense of smell or taste, wheezing, dyspnea on exertion, nausea, vomiting, diarrhea, abdominal pain.  No known COVID or flu exposure.  She got 2 doses of the COVID-vaccine and this years flu vaccine.  No antibiotics in the past month.  No antipyretic in the past 6 hours.  States that she is unable to sleep at night secondary to the cough.  She tried Tylenol/ibuprofen combination with improvement in her headache and warm compresses.  Symptoms worse with lying down.  She denies grinding her teeth or ear pain aggravated with yawning, chewing, opening /closing her mouth.  She has a past medical history of asthma, and COVID, last bout in August 2022.  No history of TMJ arthralgia.  LMP: Status post hysterectomy.  PCP: None.   Past Medical History:  Diagnosis Date   Asthma     Past Surgical History:  Procedure Laterality Date   ABDOMINAL HYSTERECTOMY     hemmorhoidectomy     HERNIA REPAIR     TUBAL LIGATION      History reviewed. No pertinent family history.  Social History   Tobacco Use   Smoking status: Every Day    Packs/day: 0.30    Types: Cigarettes   Smokeless tobacco: Never  Vaping Use   Vaping Use: Never used  Substance Use Topics   Alcohol use: No   Drug use: Not Currently    No current facility-administered medications for this encounter.  Current Outpatient Medications:    cyclobenzaprine (FLEXERIL) 10 MG tablet, Take 1 tablet (10 mg total) by mouth at bedtime., Disp: 20 tablet, Rfl: 0   ibuprofen (ADVIL) 600 MG tablet, Take 1 tablet (600 mg total) by mouth every 6 (six) hours as needed., Disp: 30 tablet, Rfl: 0   ipratropium (ATROVENT) 0.06 % nasal  spray, Place 2 sprays into both nostrils 4 (four) times daily., Disp: 15 mL, Rfl: 0   promethazine-dextromethorphan (PROMETHAZINE-DM) 6.25-15 MG/5ML syrup, Take 5 mLs by mouth 4 (four) times daily as needed for cough., Disp: 118 mL, Rfl: 0   albuterol (VENTOLIN HFA) 108 (90 Base) MCG/ACT inhaler, Inhale 1-2 puffs into the lungs every 4 (four) hours as needed for wheezing or shortness of breath., Disp: 1 each, Rfl: 0   sertraline (ZOLOFT) 25 MG tablet, Take 1 tablet by mouth 2 (two) times daily., Disp: , Rfl:    Spacer/Aero-Holding Chambers (AEROCHAMBER MV) inhaler, Use as instructed, Disp: 1 each, Rfl: 1  Allergies  Allergen Reactions   Codeine Anaphylaxis   Hydrocodone-Acetaminophen Swelling   Vicodin [Hydrocodone-Acetaminophen] Rash     ROS  As noted in HPI.   Physical Exam  BP 137/78 (BP Location: Right Arm)   Pulse 77   Temp 98.1 F (36.7 C) (Oral)   Resp 16   LMP  (LMP Unknown)   SpO2 97%   Constitutional: Well developed, well nourished, no acute distress Eyes:  EOMI, conjunctiva normal bilaterally HENT: Normocephalic, atraumatic,mucus membranes moist.  Positive nasal congestion.  Normal turbinates.  No maxillary, frontal sinus tenderness.  Bilateral external ears, EACs, TMs normal.  No pain with traction on pinna, palpation of mastoid.  Positive tenderness with palpation of tragus bilaterally.  Positive tenderness at TMJ bilaterally.  No jaw crepitus.  Normal oropharynx, normal tonsils without exudates.  Uvula midline.  Positive postnasal drip. Neck: Positive cervical lymphadenopathy Respiratory: Normal inspiratory effort, lungs clear bilaterally Cardiovascular: Normal rate, regular rhythm, no murmurs GI: nondistended soft, nontender, no splenomegaly skin: No rash, skin intact Musculoskeletal: no deformities Neurologic: Alert & oriented x 3, no focal neuro deficits Psychiatric: Speech and behavior appropriate   ED Course   Medications - No data to display  Orders  Placed This Encounter  Procedures   Resp Panel by RT-PCR (Flu A&B, Covid) Anterior Nasal Swab    Standing Status:   Standing    Number of Occurrences:   1   Nursing Communication Please set up with a PCP before discharge    Please set up with a PCP before discharge    Standing Status:   Standing    Number of Occurrences:   1    Results for orders placed or performed during the hospital encounter of 02/24/22 (from the past 24 hour(s))  Resp Panel by RT-PCR (Flu A&B, Covid) Anterior Nasal Swab     Status: None   Collection Time: 02/24/22  2:26 PM   Specimen: Anterior Nasal Swab  Result Value Ref Range   SARS Coronavirus 2 by RT PCR NEGATIVE NEGATIVE   Influenza A by PCR NEGATIVE NEGATIVE   Influenza B by PCR NEGATIVE NEGATIVE   No results found.  ED Clinical Impression  1. Viral infection   2. Encounter for laboratory testing for COVID-19 virus   3. Bilateral temporomandibular joint pain   4. Otalgia of both ears      ED Assessment/Plan     Pt with an upper respiratory infection and bilateral otalgia.  I suspect the otalgia is coming from TMJ arthralgia.  Checking COVID, influenza.  However, patient does not want to be treated with antivirals if COVID is positive.  Unfortunately, she is out of the treatment window for Tamiflu if influenza is positive.  However, checking because his implications for her family and work.  He has a prescription for albuterol inhaler with a spacer.  She can use this as needed.   COVID, influenza negative.   will send home with Tylenol/ibuprofen, Atrovent nasal spray, Promethazine DM, Flexeril at night, soft diet for the next few days.  We will set up with a primary care provider prior to sending home.  Follow-up with him/her as needed.  Discussed labs, MDM, treatment plan, and plan for follow-up with patient. patient agrees with plan.   Meds ordered this encounter  Medications   cyclobenzaprine (FLEXERIL) 10 MG tablet    Sig: Take 1 tablet  (10 mg total) by mouth at bedtime.    Dispense:  20 tablet    Refill:  0   ipratropium (ATROVENT) 0.06 % nasal spray    Sig: Place 2 sprays into both nostrils 4 (four) times daily.    Dispense:  15 mL    Refill:  0   promethazine-dextromethorphan (PROMETHAZINE-DM) 6.25-15 MG/5ML syrup    Sig: Take 5 mLs by mouth 4 (four) times daily as needed for cough.    Dispense:  118 mL    Refill:  0   ibuprofen (ADVIL) 600 MG tablet    Sig: Take 1 tablet (600 mg total) by mouth every 6 (six) hours as needed.    Dispense:  30 tablet    Refill:  0      *This clinic note was created using [employer]. Therefore, there may be occasional  mistakes despite careful proofreading.  ?    Melynda Ripple, MD 02/25/22 [phone]

## 2022-06-25 ENCOUNTER — Ambulatory Visit
Admission: RE | Admit: 2022-06-25 | Discharge: 2022-06-25 | Disposition: A | Discharge status: Home / Self Care | Payer: BC Managed Care – PPO | Source: Ambulatory Visit | Attending: Student | Admitting: Student

## 2022-06-25 VITALS — BP 133/88 | HR 78 | Temp 98.0°F | Resp 20

## 2022-06-25 DIAGNOSIS — N898 Other specified noninflammatory disorders of vagina: Secondary | ICD-10-CM | POA: Diagnosis not present

## 2022-06-25 DIAGNOSIS — R053 Chronic cough: Secondary | ICD-10-CM

## 2022-06-25 MED ORDER — CEFTRIAXONE SODIUM 500 MG IJ SOLR
500.0000 mg | INTRAMUSCULAR | Status: DC
  Administered 2022-06-25: 500 mg via INTRAMUSCULAR

## 2022-06-25 MED ORDER — BENZONATATE 100 MG PO CAPS: 100.0000 mg | ORAL_CAPSULE | Freq: Three times a day (TID) | ORAL | 0 refills | Status: DC

## 2022-06-25 MED ORDER — FLUCONAZOLE 200 MG PO TABS: 200.0000 mg | ORAL_TABLET | Freq: Every day | ORAL | 0 refills | Status: AC

## 2022-06-25 MED ORDER — METRONIDAZOLE 500 MG PO TABS: 500.0000 mg | ORAL_TABLET | Freq: Two times a day (BID) | ORAL | 0 refills | Status: DC

## 2022-06-25 MED ORDER — DOXYCYCLINE HYCLATE 100 MG PO CAPS: 100.0000 mg | ORAL_CAPSULE | Freq: Two times a day (BID) | ORAL | 0 refills | Status: DC

## 2022-06-25 NOTE — ED Provider Notes (Addendum)
Alexis Gilbert    CSN: [id number] Arrival date & time: 06/25/22  1742      History   Chief Complaint Chief Complaint  Patient presents with   SEXUALLY TRANSMITTED DISEASE    I do believe my partner has given me something. - Entered by patient    HPI Alexis Gilbert is a 52 y.o. female for evaluation of a STD.  The patient reports that her female partner cheated on her with another female.  The patient herself denies any new sexual partners.  The patient began to experience a vaginal discharge on Saturday, initially she noticed that the discharge was white, it did not particularly have an odor however over time she began to notice more of a yellowish color without any significant odor.  She does report itching, she also reports a irritated feeling as well.  She denies any urinary symptoms such as odor, frequency or bleeding.  She denies any vaginal bleeding.  The patient does have a history of hysterectomy.  The patient denies any abdominal pain.  10 months ago the patient was treated for gonorrhea and chlamydia, the patient was evaluated by [facility] where she underwent this treatment.  At this time she did not undergo prophylactic treatment and had to return after her test results returned positive.  She is interested in prophylactic treatment at this time.  Of note the patient also reports several week history of facial congestion and stuffy nose.  The patient denies any fevers chills.  She has been taking over-the-counter medication such as over-the-counter Flonase and antihistamines without significant relief.  She also does report a cough associated with this as well but denies any sore throat.  HPI  Past Medical History:  Diagnosis Date   Asthma     Patient Active Problem List   Diagnosis Date Noted   Dysphagia 03/07/2018    Past Surgical History:  Procedure Laterality Date   ABDOMINAL HYSTERECTOMY     hemmorhoidectomy     HERNIA REPAIR     TUBAL LIGATION       OB History   No obstetric history on file.      Home Medications    Prior to Admission medications   Medication Sig Start Date End Date Taking? Authorizing Provider  albuterol (VENTOLIN HFA) 108 (90 Base) MCG/ACT inhaler Inhale 1-2 puffs into the lungs every 4 (four) hours as needed for wheezing or shortness of breath. 10/17/21   Melynda Ripple, MD  cyclobenzaprine (FLEXERIL) 10 MG tablet Take 1 tablet (10 mg total) by mouth at bedtime. 02/24/22   Melynda Ripple, MD  ibuprofen (ADVIL) 600 MG tablet Take 1 tablet (600 mg total) by mouth every 6 (six) hours as needed. 02/24/22   Melynda Ripple, MD  ipratropium (ATROVENT) 0.06 % nasal spray Place 2 sprays into both nostrils 4 (four) times daily. 02/24/22   Melynda Ripple, MD  promethazine-dextromethorphan (PROMETHAZINE-DM) 6.25-15 MG/5ML syrup Take 5 mLs by mouth 4 (four) times daily as needed for cough. 02/24/22   Melynda Ripple, MD  sertraline (ZOLOFT) 25 MG tablet Take 1 tablet by mouth 2 (two) times daily. 12/31/19   [provider]  Spacer/Aero-Holding Chambers (AEROCHAMBER MV) inhaler Use as instructed 10/17/21   Melynda Ripple, MD  buPROPion (WELLBUTRIN XL) 150 MG 24 hr tablet Take 150 mg by mouth daily.  09/11/20  [provider]  escitalopram (LEXAPRO) 20 MG tablet Take 1 tablet by mouth daily. 12/29/15 03/02/19  [provider]  levonorgestrel (MIRENA) 20  MCG/24HR IUD 1 each once by Intrauterine route.  03/02/19  [provider]    Family History History reviewed. No pertinent family history.  Social History Social History   Tobacco Use   Smoking status: Every Day    Packs/day: 0.30    Types: Cigarettes   Smokeless tobacco: Never  Vaping Use   Vaping Use: Never used  Substance Use Topics   Alcohol use: No   Drug use: Not Currently     Allergies   Codeine, Hydrocodone-acetaminophen, and Vicodin [hydrocodone-acetaminophen]   Review of Systems Review of Systems   Constitutional: Negative.  Negative for fever.  HENT: Negative.    Eyes: Negative.   Respiratory: Negative.    Cardiovascular: Negative.   Gastrointestinal: Negative.   Endocrine: Negative.   Genitourinary:  Positive for vaginal discharge. Negative for urgency and vaginal bleeding.  Musculoskeletal: Negative.   Skin: Negative.   Allergic/Immunologic: Negative.   Neurological: Negative.   Hematological: Negative.   Psychiatric/Behavioral: Negative.      Physical Exam Triage Vital Signs ED Triage Vitals  Enc Vitals Group     BP 06/25/22 1800 133/88     Pulse Rate 06/25/22 1800 78     Resp 06/25/22 1800 20     Temp 06/25/22 1800 98 F (36.7 C)     Temp src --      SpO2 06/25/22 1800 98 %     Weight --      Height --      Head Circumference --      Peak Flow --      Pain Score 06/25/22 1756 0     Pain Loc --      Pain Edu? --      Excl. in [place]? --    No data found.  Updated Vital Signs BP 133/88   Pulse 78   Temp 98 F (36.7 C)   Resp 20   LMP  (LMP Unknown)   SpO2 98%   Visual Acuity Right Eye Distance:   Left Eye Distance:   Bilateral Distance:    Right Eye Near:   Left Eye Near:    Bilateral Near:     Physical Exam Constitutional:      General: She is not in acute distress.    Appearance: She is not ill-appearing or toxic-appearing.  HENT:     Right Ear: Tympanic membrane normal.     Left Ear: Tympanic membrane normal.     Nose: Congestion present.     Mouth/Throat:     Mouth: Mucous membranes are dry.     Pharynx: No oropharyngeal exudate or posterior oropharyngeal erythema.  Eyes:     Extraocular Movements: Extraocular movements intact.     Pupils: Pupils are equal, round, and reactive to light.  Cardiovascular:     Rate and Rhythm: Normal rate and regular rhythm.     Heart sounds: No murmur heard.    No friction rub. No gallop.  Pulmonary:     Effort: Pulmonary effort is normal.     Breath sounds: Normal breath sounds. No stridor. No  wheezing or rhonchi.  Abdominal:     General: Abdomen is flat.     Palpations: Abdomen is soft.     Tenderness: There is no abdominal tenderness. There is no right CVA tenderness or left CVA tenderness.  Musculoskeletal:     Cervical back: Normal range of motion.  Lymphadenopathy:     Cervical: No cervical adenopathy.  Neurological:  Mental Status: She is alert.      UC Treatments / Results  Labs (all labs ordered are listed, but only abnormal results are displayed) Labs Reviewed - No data to display  EKG   Radiology No results found.  Procedures Procedures (including critical care time)  Medications Ordered in UC Medications - No data to display  Initial Impression / Assessment and Plan / UC Course  I have reviewed the triage vital signs and the nursing notes.  Pertinent labs & imaging results that were available during my care of the patient were reviewed by me and considered in my medical decision making (see chart for details).     1.  Treatment options were discussed today with the patient. 2.  Systolic she has been collected to check for chlamydia, gonorrhea, trichomonas, bacterial vaginosis in addition to candidiasis.  Results should return in the next several days. 3.  As a precaution the patient will be treated empirically, she was given a IM Rocephin injection today's visit.  The patient was also prescribed Flagyl, doxycycline in addition to Diflucan. 4.  Reviewed safe sex practices, notifying partners, and refraining from sexual activities for 7 days from time of treatment. Patient verified understanding, all questions answered.  5.  The patient was given a prescription for [facility] as well for cough, she was instructed to follow-up if no future improvement. 5.  She will follow-up as needed at this time. Final Clinical Impressions(s) / UC Diagnoses   Final diagnoses:  None   Discharge Instructions   None    ED Prescriptions   None    PDMP  not reviewed this encounter.   Lattie Corns, PA-C 06/25/22 1856    Lattie Corns, PA-C 06/25/22 1942

## 2022-06-25 NOTE — Discharge Instructions (Signed)
-  Take medications as directed. -Begin eating daily yogurt with probiotic. -Lab results should be available by the end of the week.

## 2022-06-25 NOTE — ED Triage Notes (Addendum)
Patient to Urgent Care with complaints of vaginal discharge. Describes it as thin/ non-odorous/ yellow. Also reports some irritation. Reports concerns for possible STD.   Denies any urinary symptoms.  Symptoms started Saturday.   Declines need for blood work.

## 2022-06-26 LAB — CERVICOVAGINAL ANCILLARY ONLY
Bacterial Vaginitis (gardnerella): NEGATIVE
Candida Glabrata: NEGATIVE
Candida Vaginitis: NEGATIVE
Chlamydia: NEGATIVE
Comment: NEGATIVE
Comment: NEGATIVE
Comment: NEGATIVE
Comment: NEGATIVE
Comment: NEGATIVE
Comment: NORMAL
Neisseria Gonorrhea: NEGATIVE
Trichomonas: POSITIVE — AB

## 2022-08-26 ENCOUNTER — Telehealth: Payer: BC Managed Care – PPO | Admitting: Family Medicine

## 2022-08-26 DIAGNOSIS — J019 Acute sinusitis, unspecified: Secondary | ICD-10-CM

## 2022-08-26 DIAGNOSIS — B9689 Other specified bacterial agents as the cause of diseases classified elsewhere: Secondary | ICD-10-CM

## 2022-08-26 MED ORDER — BENZONATATE 200 MG PO CAPS: 200.0000 mg | ORAL_CAPSULE | Freq: Two times a day (BID) | ORAL | 0 refills | Status: DC | PRN

## 2022-08-26 MED ORDER — ALBUTEROL SULFATE HFA 108 (90 BASE) MCG/ACT IN AERS: 1.0000 | INHALATION_SPRAY | Freq: Four times a day (QID) | RESPIRATORY_TRACT | 0 refills | Status: DC | PRN

## 2022-08-26 MED ORDER — AMOXICILLIN-POT CLAVULANATE 875-125 MG PO TABS: 1.0000 | ORAL_TABLET | Freq: Two times a day (BID) | ORAL | 0 refills | Status: AC

## 2022-08-26 MED ORDER — PSEUDOEPH-BROMPHEN-DM 30-2-10 MG/5ML PO SYRP: 5.0000 mL | ORAL_SOLUTION | Freq: Four times a day (QID) | ORAL | 0 refills | Status: DC | PRN

## 2022-08-26 NOTE — Patient Instructions (Addendum)
Dustin Folks, thank you for joining Perlie Mayo, NP for today's virtual visit.  While this provider is not your primary care provider (PCP), if your PCP is located in our provider database this encounter information will be shared with them immediately following your visit.   [facility] account gives you access to today's visit and all your visits, tests, and labs performed at [facility] " click here if you don't have a [facility] account or go to [email or web address]  Consent: (Patient) Alexis Gilbert provided verbal consent for this virtual visit at the beginning of the encounter.  Current Medications:  Current Outpatient Medications:    albuterol (VENTOLIN HFA) 108 (90 Base) MCG/ACT inhaler, Inhale 1-2 puffs into the lungs every 6 (six) hours as needed for wheezing or shortness of breath., Disp: 8 g, Rfl: 0   amoxicillin-clavulanate (AUGMENTIN) 875-125 MG tablet, Take 1 tablet by mouth 2 (two) times daily for 7 days., Disp: 14 tablet, Rfl: 0   benzonatate (TESSALON) 200 MG capsule, Take 1 capsule (200 mg total) by mouth 2 (two) times daily as needed for cough., Disp: 20 capsule, Rfl: 0   brompheniramine-pseudoephedrine-DM 30-2-10 MG/5ML syrup, Take 5 mLs by mouth 4 (four) times daily as needed., Disp: 120 mL, Rfl: 0   Medications ordered in this encounter:  Meds ordered this encounter  Medications   albuterol (VENTOLIN HFA) 108 (90 Base) MCG/ACT inhaler    Sig: Inhale 1-2 puffs into the lungs every 6 (six) hours as needed for wheezing or shortness of breath.    Dispense:  8 g    Refill:  0    Order Specific Question:   Supervising Provider    Answer:   Chase Picket [id number]   amoxicillin-clavulanate (AUGMENTIN) 875-125 MG tablet    Sig: Take 1 tablet by mouth 2 (two) times daily for 7 days.    Dispense:  14 tablet    Refill:  0    Order Specific Question:   Supervising Provider    Answer:   Chase Picket [id number]    benzonatate (TESSALON) 200 MG capsule    Sig: Take 1 capsule (200 mg total) by mouth 2 (two) times daily as needed for cough.    Dispense:  20 capsule    Refill:  0    Order Specific Question:   Supervising Provider    Answer:   Chase Picket [id number]   brompheniramine-pseudoephedrine-DM 30-2-10 MG/5ML syrup    Sig: Take 5 mLs by mouth 4 (four) times daily as needed.    Dispense:  120 mL    Refill:  0    Order Specific Question:   Supervising Provider    Answer:   Chase Picket [id number]     *If you need refills on other medications prior to your next appointment, please contact your pharmacy*  Follow-Up: Call back or seek an in-person evaluation if the symptoms worsen or if the condition fails to improve as anticipated.  [facility] [phone]  Other Instructions  -Take meds as prescribed -Rest -Use a cool mist humidifier especially during the winter months when heat dries out the air. - Use saline nose sprays frequently to help soothe nasal passages and promote drainage. -Saline irrigations of the nose can be very helpful if done frequently.             * 4X daily for 1 week*             *  Use of a nettie pot can be helpful with this.  *Follow directions with this* *Boiled or distilled water only -stay hydrated by drinking plenty of fluids - Keep thermostat turn down low to prevent drying out sinuses - For any cough or congestion- robitussin DM or Delsym as needed - For fever or aches or pains- take tylenol or ibuprofen as directed on bottle             * for fevers greater than 101 orally you may alternate ibuprofen and tylenol every 3 hours.  If you do not improve you will need a follow up visit in person.                  If you have been instructed to have an in-person evaluation today at a local Urgent Care facility, please use the link below. It will take you to a list of all of our available [facility], including address,  phone number and hours of operation. Please do not delay care.  [facility]  If you or a family member do not have a primary care provider, use the link below to schedule a visit and establish care. When you choose a [facility] primary care physician or advanced practice provider, you gain a long-term partner in health. Find a Primary Care Provider  Learn more about [facility]'s in-office and virtual care options: [facility]

## 2022-08-26 NOTE — Progress Notes (Signed)
Virtual Visit Consent   Alexis Gilbert, you are scheduled for a virtual visit with a [facility] provider today. Just as with appointments in the office, your consent must be obtained to participate. Your consent will be active for this visit and any virtual visit you may have with one of our providers in the next 365 days. If you have a MyChart account, a copy of this consent can be sent to you electronically.  As this is a virtual visit, video technology does not allow for your provider to perform a traditional examination. This may limit your provider's ability to fully assess your condition. If your provider identifies any concerns that need to be evaluated in person or the need to arrange testing (such as labs, EKG, etc.), we will make arrangements to do so. Although advances in technology are sophisticated, we cannot ensure that it will always work on either your end or our end. If the connection with a video visit is poor, the visit may have to be switched to a telephone visit. With either a video or telephone visit, we are not always able to ensure that we have a secure connection.  By engaging in this virtual visit, you consent to the provision of healthcare and authorize for your insurance to be billed (if applicable) for the services provided during this visit. Depending on your insurance coverage, you may receive a charge related to this service.  I need to obtain your verbal consent now. Are you willing to proceed with your visit today? Alexis Gilbert has provided verbal consent on 08/26/2022 for a virtual visit (video or telephone). Perlie Mayo, NP  Date: 08/26/2022 2:06 PM  Virtual Visit via Video Note   I, Perlie Mayo, connected with  Alexis Gilbert  ([id number], 1970/11/14) on 08/26/22 at  2:00 PM EDT by a video-enabled telemedicine application and verified that I am speaking with the correct person using two identifiers.  Location: Patient: Virtual Visit Location  Patient: Home Provider: Virtual Visit Location Provider: Home Office   I discussed the limitations of evaluation and management by telemedicine and the availability of in person appointments. The patient expressed understanding and agreed to proceed.    History of Present Illness: Alexis Gilbert is a 52 y.o. who identifies as a female who was assigned female at birth, and is being seen today for congestion  Onset was congestion for a year and half- but pressure in sinuses and headache daily Associated symptoms are snoring, coughing-colored mucus, wheezing Modifying factors are breath right strips Denies chest pain, shortness of breath, fevers, chills  Exposure to sick contacts- unknown Currently Smoker- half pack daily   Problems:  Patient Active Problem List   Diagnosis Date Noted   Dysphagia 03/07/2018    Allergies:  Allergies  Allergen Reactions   Codeine Anaphylaxis   Hydrocodone-Acetaminophen Swelling   Vicodin [Hydrocodone-Acetaminophen] Rash   Medications: No current outpatient medications on file.  Observations/Objective: Patient is well-developed, well-nourished in no acute distress.  Resting comfortably  at home.  Head is normocephalic, atraumatic.  No labored breathing.  Speech is clear and coherent with logical content.  Patient is alert and oriented at baseline.  Cough  Congestion Smokers tone  Assessment and Plan:  1. Acute bacterial sinusitis  - albuterol (VENTOLIN HFA) 108 (90 Base) MCG/ACT inhaler; Inhale 1-2 puffs into the lungs every 6 (six) hours as needed for wheezing or shortness of breath.  Dispense: 8 g; Refill: 0 - amoxicillin-clavulanate (AUGMENTIN) 875-125  MG tablet; Take 1 tablet by mouth 2 (two) times daily for 7 days.  Dispense: 14 tablet; Refill: 0 - benzonatate (TESSALON) 200 MG capsule; Take 1 capsule (200 mg total) by mouth 2 (two) times daily as needed for cough.  Dispense: 20 capsule; Refill: 0 -  brompheniramine-pseudoephedrine-DM 30-2-10 MG/5ML syrup; Take 5 mLs by mouth 4 (four) times daily as needed.  Dispense: 120 mL; Refill: 0  -Take meds as prescribed -Rest -Use a cool mist humidifier especially during the winter months when heat dries out the air. - Use saline nose sprays frequently to help soothe nasal passages and promote drainage. -Saline irrigations of the nose can be very helpful if done frequently.             * 4X daily for 1 week*             * Use of a nettie pot can be helpful with this.  *Follow directions with this* *Boiled or distilled water only -stay hydrated by drinking plenty of fluids - Keep thermostat turn down low to prevent drying out sinuses - For any cough or congestion- robitussin DM or Delsym as needed - For fever or aches or pains- take tylenol or ibuprofen as directed on bottle             * for fevers greater than 101 orally you may alternate ibuprofen and tylenol every 3 hours.  If you do not improve you will need a follow up visit in person.              Reviewed side effects, risks and benefits of medication.    Patient acknowledged agreement and understanding of the plan.   Past Medical, Surgical, Social History, Allergies, and Medications have been Reviewed.     Follow Up Instructions: I discussed the assessment and treatment plan with the patient. The patient was provided an opportunity to ask questions and all were answered. The patient agreed with the plan and demonstrated an understanding of the instructions.  A copy of instructions were sent to the patient via MyChart unless otherwise noted below.    The patient was advised to call back or seek an in-person evaluation if the symptoms worsen or if the condition fails to improve as anticipated.  Time:  I spent 10 minutes with the patient via telehealth technology discussing the above problems/concerns.    Perlie Mayo, NP

## 2022-09-27 ENCOUNTER — Ambulatory Visit (INDEPENDENT_AMBULATORY_CARE_PROVIDER_SITE_OTHER): Payer: BC Managed Care – PPO | Admitting: Internal Medicine

## 2022-09-27 ENCOUNTER — Encounter: Payer: Self-pay | Admitting: Internal Medicine

## 2022-09-27 VITALS — BP 116/72 | HR 85 | Temp 97.8°F | Resp 16 | Ht 66.5 in | Wt 191.5 lb

## 2022-09-27 DIAGNOSIS — Z13220 Encounter for screening for lipoid disorders: Secondary | ICD-10-CM

## 2022-09-27 DIAGNOSIS — J329 Chronic sinusitis, unspecified: Secondary | ICD-10-CM | POA: Diagnosis not present

## 2022-09-27 DIAGNOSIS — N951 Menopausal and female climacteric states: Secondary | ICD-10-CM | POA: Diagnosis not present

## 2022-09-27 DIAGNOSIS — Z1211 Encounter for screening for malignant neoplasm of colon: Secondary | ICD-10-CM

## 2022-09-27 DIAGNOSIS — Z1231 Encounter for screening mammogram for malignant neoplasm of breast: Secondary | ICD-10-CM

## 2022-09-27 DIAGNOSIS — J45909 Unspecified asthma, uncomplicated: Secondary | ICD-10-CM

## 2022-09-27 MED ORDER — BENZONATATE 100 MG PO CAPS: 100.0000 mg | ORAL_CAPSULE | Freq: Two times a day (BID) | ORAL | 1 refills | Status: DC | PRN

## 2022-09-27 MED ORDER — MONTELUKAST SODIUM 10 MG PO TABS: 10.0000 mg | ORAL_TABLET | Freq: Every day | ORAL | 3 refills | Status: DC

## 2022-09-27 NOTE — Patient Instructions (Addendum)
It was great seeing you today!  Plan discussed at today's visit: -Blood work ordered today, results will be uploaded to MyChart.  -Mammogram ordered, please call number on card to schedule -Cologuard ordered as well for colon cancer screening -For sinus issues, recommend going on OTC Xyzal, will add Singulair as well.  -Referral to ENT placed   Follow up in: 1 month   Take care and let us know if you have any questions or concerns prior to your next visit.  Dr. Epimenio Foot Tablets What is this medication? FEZOLINETANT (FEZ oh LIN e tant) reduces the number and severity of hot flashes due to menopause. It works by blocking substances in your body that cause hot flashes and night sweats. This medicine may be used for other purposes; ask your health care provider or pharmacist if you have questions. COMMON BRAND NAME(S): VEOZAH What should I tell my care team before I take this medication? They need to know if you have any of these conditions: Kidney disease Liver disease An unusual or allergic reaction to fezolinetant, other medications, foods, dyes, or preservatives Pregnant or trying to get pregnant Breastfeeding How should I use this medication? Take this medication by mouth with water. Take it as directed on the prescription label at the same time every day. Do not cut, crush, or chew this medication. Swallow the tablets whole. You can take it with or without food. If it upsets your stomach, take it with food. Keep taking it unless your care team tells you to stop. Talk to your care team about the use of this medication in children. Special care may be needed. Overdosage: If you think you have taken too much of this medicine contact a poison control center or emergency room at once. NOTE: This medicine is only for you. Do not share this medicine with others. What if I miss a dose? If you miss a dose, take it as soon as you can unless it is more than 12 hours late. If it  is more than 12 hours late, skip the missed dose. Take the next dose at the normal time. What may interact with this medication? Other medications may affect the way this medication works. Talk with your care team about all of the medications you take. They may suggest changes to your treatment plan to lower the risk of side effects and to make sure your medications work as intended. This list may not describe all possible interactions. Give your health care provider a list of all the medicines, herbs, non-prescription drugs, or dietary supplements you use. Also tell them if you smoke, drink alcohol, or use illegal drugs. Some items may interact with your medicine. What should I watch for while using this medication? Visit your care team for regular checks on your progress. Tell your care team if your symptoms do not start to get better or if they get worse. You may need blood work while taking this medication. What side effects may I notice from receiving this medication? Side effects that you should report to your care team as soon as possible: Allergic reactions--skin rash, itching, hives, swelling of the face, lips, tongue, or throat Liver injury--right upper belly pain, loss of appetite, nausea, light-colored stool, dark yellow or brown urine, yellowing skin or eyes, unusual weakness or fatigue Side effects that usually do not require medical attention (report these to your care team if they continue or are bothersome): Back pain Diarrhea Hot flashes Stomach pain Trouble sleeping This  list may not describe all possible side effects. Call your doctor for medical advice about side effects. You may report side effects to FDA at 1-800-FDA-1088. Where should I keep my medication? Keep out of the reach of children and pets. Store at room temperature between 20 and 25 degrees C (68 and 77 degrees F). Get rid of any unused medication after the expiration date. To get rid of medications that are no  longer needed or have expired: Take the medication to a medication take-back program. Check with your pharmacy or law enforcement to find a location. If you cannot return the medication, check the label or package insert to see if the medication should be thrown out in the garbage or flushed down the toilet. If you are not sure, ask your care team. If it is safe to put it in the trash, take the medication out of the container. Mix the medication with cat litter, dirt, coffee grounds, or other unwanted substance. Seal the mixture in a bag or container. Put it in the trash. NOTE: This sheet is a summary. It may not cover all possible information. If you have questions about this medicine, talk to your doctor, pharmacist, or health care provider.  2023 Elsevier/Gold Standard (2021-10-18 00:00:00)

## 2022-09-27 NOTE — Progress Notes (Signed)
New Patient Office Visit  Subjective    Patient ID: Alexis Gilbert, female    DOB: [birth year]  Age: 52 y.o. MRN: [id number]  CC:  Chief Complaint  Patient presents with   Establish Care    HPI [name] presents to establish care.  Hx of Asthma/Environmental Allergies: -Has taken oral anti-histamines for years without relief, has constant nasal congestion, now snores every night -No witness apneic events, uses breathing strips which does help -Has an Albuterol inhaler to use as needed but cough/shortness of breath always in conjunction with allergy symptoms  VSM: -Hot flashes constantly, has been going on 4 years -Had been on estrogen patch, Effexor without control of symptoms  Health Maintenance: -Blood work due -Mammogram due -Cologuard due   Outpatient Encounter Medications as of 09/27/2022  Medication Sig   [DISCONTINUED] albuterol (VENTOLIN HFA) 108 (90 Base) MCG/ACT inhaler Inhale 1-2 puffs into the lungs every 6 (six) hours as needed for wheezing or shortness of breath.   [DISCONTINUED] benzonatate (TESSALON) 200 MG capsule Take 1 capsule (200 mg total) by mouth 2 (two) times daily as needed for cough.   [DISCONTINUED] brompheniramine-pseudoephedrine-DM 30-2-10 MG/5ML syrup Take 5 mLs by mouth 4 (four) times daily as needed.   [DISCONTINUED] buPROPion (WELLBUTRIN XL) 150 MG 24 hr tablet Take 150 mg by mouth daily.   [DISCONTINUED] escitalopram (LEXAPRO) 20 MG tablet Take 1 tablet by mouth daily.   [DISCONTINUED] levonorgestrel (MIRENA) 20 MCG/24HR IUD 1 each once by Intrauterine route.   No facility-administered encounter medications on file as of 09/27/2022.    Past Medical History:  Diagnosis Date   Asthma     Past Surgical History:  Procedure Laterality Date   ABDOMINAL HYSTERECTOMY     hemmorhoidectomy     HERNIA REPAIR     TUBAL LIGATION      Family History  Problem Relation Age of Onset   COPD Mother    Emphysema Mother    Cancer Father     Kidney disease Son    Autism Son     Social History   Socioeconomic History   Marital status: Legally Separated    Spouse name: Not on file   Number of children: Not on file   Years of education: Not on file   Highest education level: Not on file  Occupational History   Not on file  Tobacco Use   Smoking status: Every Day    Packs/day: .3    Types: Cigarettes   Smokeless tobacco: Never  Vaping Use   Vaping Use: Never used  Substance and Sexual Activity   Alcohol use: No   Drug use: Never   Sexual activity: Yes  Other Topics Concern   Not on file  Social History Narrative   Not on file   Social Determinants of Health   Financial Resource Strain: Not on file  Food Insecurity: Not on file  Transportation Needs: Not on file  Physical Activity: Not on file  Stress: Not on file  Social Connections: Not on file  Intimate Partner Violence: Not on file    Review of Systems  Constitutional:  Negative for chills and fever.  HENT:  Negative for ear pain, sinus pain and sore throat.   Eyes:  Negative for blurred vision.  Respiratory:  Negative for shortness of breath and wheezing.   Cardiovascular:  Negative for chest pain.        Objective    BP 116/72   Pulse 85  Temp 97.8 F (36.6 C)   Resp 16   Ht 5' 6.5" (1.689 m)   Wt 191 lb 8 oz (86.9 kg)   LMP  (LMP Unknown)   SpO2 98%   BMI 30.45 kg/m   Physical Exam Constitutional:      Appearance: Normal appearance.  HENT:     Head: Normocephalic and atraumatic.     Nose: Congestion present.     Mouth/Throat:     Mouth: Mucous membranes are moist.     Pharynx: Oropharynx is clear.  Eyes:     Conjunctiva/sclera: Conjunctivae normal.  Cardiovascular:     Rate and Rhythm: Normal rate and regular rhythm.  Pulmonary:     Effort: Pulmonary effort is normal.     Breath sounds: Normal breath sounds.  Musculoskeletal:     Right lower leg: No edema.     Left lower leg: No edema.  Skin:    General: Skin is  warm and dry.  Neurological:     General: No focal deficit present.     Mental Status: She is alert. Mental status is at baseline.  Psychiatric:        Mood and Affect: Mood normal.        Behavior: Behavior normal.         Assessment & Plan:   1. Asthma due to environmental allergies/Chronic sinusitis, unspecified location: Recommend Xyzal, will start Singulair for allergies. Labs today as well. Continue Albuterol as needed. Referral placed to ENT to due discuss chronic sinus symptoms.  - CBC w/Diff/Platelet - COMPLETE METABOLIC PANEL WITH GFR - montelukast (SINGULAIR) 10 MG tablet; Take 1 tablet (10 mg total) by mouth at bedtime.  Dispense: 30 tablet; Refill: 3 - benzonatate (TESSALON) 100 MG capsule; Take 1 capsule (100 mg total) by mouth 2 (two) times daily as needed for cough.  Dispense: 20 capsule; Refill: 1 - albuterol (VENTOLIN HFA) 108 (90 Base) MCG/ACT inhaler; Inhale 2 puffs into the lungs every 6 (six) hours as needed for wheezing or shortness of breath.  Dispense: 8 g; Refill: 2 - Ambulatory referral to ENT  2. Perimenopausal vasomotor symptoms: Considering Veozah for VMS, will check kidneys and liver prior to prescribing.  3. Lipid screening: Check cholesterol today.   - Lipid Profile  4. Encounter for screening mammogram for malignant neoplasm of breast: Mammogram ordered.   - MM 3D SCREENING MAMMOGRAM BILATERAL BREAST; Future  5. Screening for colon cancer: Colon cancer screening ordered.  - Cologuard  Return in about 4 weeks (around 10/25/2022).   Margarita Mail, DO

## 2022-09-28 LAB — CBC WITH DIFFERENTIAL/PLATELET
Absolute Monocytes: 403 cells/uL (ref 200–950)
Basophils Absolute: 50 cells/uL (ref 0–200)
Basophils Relative: 0.9 %
Eosinophils Absolute: 218 cells/uL (ref 15–500)
Eosinophils Relative: 3.9 %
HCT: 41.4 % (ref 35.0–45.0)
Hemoglobin: 13.8 g/dL (ref 11.7–15.5)
Lymphs Abs: 3102 cells/uL (ref 850–3900)
MCH: 30.1 pg (ref 27.0–33.0)
MCHC: 33.3 g/dL (ref 32.0–36.0)
MCV: 90.2 fL (ref 80.0–100.0)
MPV: 10.7 fL (ref 7.5–12.5)
Monocytes Relative: 7.2 %
Neutro Abs: 1826 cells/uL (ref 1500–7800)
Neutrophils Relative %: 32.6 %
Platelets: 183 10*3/uL (ref 140–400)
RBC: 4.59 10*6/uL (ref 3.80–5.10)
RDW: 12.5 % (ref 11.0–15.0)
Total Lymphocyte: 55.4 %
WBC: 5.6 10*3/uL (ref 3.8–10.8)

## 2022-09-28 LAB — COMPLETE METABOLIC PANEL WITH GFR
AG Ratio: 2 (calc) (ref 1.0–2.5)
ALT: 12 U/L (ref 6–29)
AST: 10 U/L (ref 10–35)
Albumin: 4.3 g/dL (ref 3.6–5.1)
Alkaline phosphatase (APISO): 77 U/L (ref 31–125)
BUN: 15 mg/dL (ref 7–25)
CO2: 27 mmol/L (ref 20–32)
Calcium: 9.6 mg/dL (ref 8.6–10.2)
Chloride: 106 mmol/L (ref 98–110)
Creat: 0.89 mg/dL (ref 0.50–0.99)
Globulin: 2.1 g/dL (calc) (ref 1.9–3.7)
Glucose, Bld: 97 mg/dL (ref 65–99)
Potassium: 3.9 mmol/L (ref 3.5–5.3)
Sodium: 139 mmol/L (ref 135–146)
Total Bilirubin: 0.3 mg/dL (ref 0.2–1.2)
Total Protein: 6.4 g/dL (ref 6.1–8.1)
eGFR: 79 mL/min/{1.73_m2} (ref >=60)

## 2022-09-28 LAB — LIPID PANEL
Cholesterol: 163 mg/dL (ref <200)
HDL: 55 mg/dL (ref >=50)
LDL Cholesterol (Calc): 84 mg/dL (calc)
Non-HDL Cholesterol (Calc): 108 mg/dL (calc) (ref <130)
Total CHOL/HDL Ratio: 3 (calc) (ref <5.0)
Triglycerides: 141 mg/dL (ref <150)

## 2022-09-30 ENCOUNTER — Other Ambulatory Visit: Payer: Self-pay | Admitting: Internal Medicine

## 2022-09-30 ENCOUNTER — Telehealth: Payer: Self-pay | Admitting: Internal Medicine

## 2022-09-30 DIAGNOSIS — N951 Menopausal and female climacteric states: Secondary | ICD-10-CM

## 2022-09-30 MED ORDER — VEOZAH 45 MG PO TABS: 45.0000 mg | ORAL_TABLET | Freq: Every day | ORAL | 0 refills | Status: DC

## 2022-09-30 NOTE — Telephone Encounter (Signed)
Pt is calling in because she was prescribed Fezolinetant (VEOZAH) 45 MG TABS [[id number]] and says her insurance doesn't cover any of it, and it's $500. Pt would like to know if there is another medication she can be prescribed. Pt is requesting someone give her a call regarding this.

## 2022-10-01 NOTE — Telephone Encounter (Signed)
Left detailed vm to call us back with her decision.

## 2022-10-03 ENCOUNTER — Encounter: Payer: Self-pay | Admitting: Internal Medicine

## 2022-10-06 ENCOUNTER — Other Ambulatory Visit: Payer: Self-pay | Admitting: Internal Medicine

## 2022-10-06 DIAGNOSIS — N951 Menopausal and female climacteric states: Secondary | ICD-10-CM

## 2022-10-25 ENCOUNTER — Ambulatory Visit: Payer: Self-pay | Admitting: Internal Medicine

## 2022-10-29 ENCOUNTER — Encounter: Payer: Self-pay | Admitting: Certified Nurse Midwife

## 2022-10-29 ENCOUNTER — Ambulatory Visit (INDEPENDENT_AMBULATORY_CARE_PROVIDER_SITE_OTHER): Payer: BC Managed Care – PPO | Admitting: Certified Nurse Midwife

## 2022-10-29 VITALS — BP 111/78 | HR 77 | Ht 66.0 in | Wt 190.6 lb

## 2022-10-29 DIAGNOSIS — R232 Flushing: Secondary | ICD-10-CM

## 2022-10-29 DIAGNOSIS — N951 Menopausal and female climacteric states: Secondary | ICD-10-CM

## 2022-10-29 MED ORDER — ESTRADIOL 1 MG PO TABS: 1.0000 mg | ORAL_TABLET | Freq: Every day | ORAL | 3 refills | Status: DC

## 2022-10-29 NOTE — Progress Notes (Signed)
GYN ENCOUNTER NOTE  Subjective:       CECILA Gilbert is a 52 y.o. No obstetric history on file. female is here for gynecologic evaluation of the following issues:  1. Hot flashes multiple times a day, affecting daily life. Has tried patch , Effexor , and Wellbutrin. Did not like the Effexor or the Wellbutrin. She patch was not strong enough to help with the hot flashes.     Gynecologic History No LMP recorded (lmp unknown). Patient has had a hysterectomy. Contraception: status post hysterectomy Last Pap: 11/18/2017. Results were: normal Last mammogram: ordered. R  Obstetric History OB History  No obstetric history on file.    Past Medical History:  Diagnosis Date   Asthma     Past Surgical History:  Procedure Laterality Date   ABDOMINAL HYSTERECTOMY     hemmorhoidectomy     HERNIA REPAIR     TUBAL LIGATION      Current Outpatient Medications on File Prior to Visit  Medication Sig Dispense Refill   albuterol (VENTOLIN HFA) 108 (90 Base) MCG/ACT inhaler Inhale 2 puffs into the lungs every 6 (six) hours as needed for wheezing or shortness of breath. (Patient not taking: Reported on 10/29/2022) 8 g 2   benzonatate (TESSALON) 100 MG capsule Take 1 capsule (100 mg total) by mouth 2 (two) times daily as needed for cough. (Patient not taking: Reported on 10/29/2022) 20 capsule 1   Fezolinetant (VEOZAH) 45 MG TABS Take 1 tablet (45 mg total) by mouth daily. (Patient not taking: Reported on 10/29/2022) 30 tablet 0   montelukast (SINGULAIR) 10 MG tablet Take 1 tablet (10 mg total) by mouth at bedtime. (Patient not taking: Reported on 10/29/2022) 30 tablet 3   [DISCONTINUED] buPROPion (WELLBUTRIN XL) 150 MG 24 hr tablet Take 150 mg by mouth daily.     [DISCONTINUED] escitalopram (LEXAPRO) 20 MG tablet Take 1 tablet by mouth daily.     [DISCONTINUED] levonorgestrel (MIRENA) 20 MCG/24HR IUD 1 each once by Intrauterine route.     No current facility-administered medications on file prior to  visit.    Allergies  Allergen Reactions   Codeine Anaphylaxis   Hydrocodone-Acetaminophen Swelling   Vicodin [Hydrocodone-Acetaminophen] Rash    Social History   Socioeconomic History   Marital status: Legally Separated    Spouse name: Not on file   Number of children: Not on file   Years of education: Not on file   Highest education level: Not on file  Occupational History   Not on file  Tobacco Use   Smoking status: Every Day    Packs/day: .3    Types: Cigarettes   Smokeless tobacco: Never  Vaping Use   Vaping Use: Never used  Substance and Sexual Activity   Alcohol use: No   Drug use: Never   Sexual activity: Yes  Other Topics Concern   Not on file  Social History Narrative   Not on file   Social Determinants of Health   Financial Resource Strain: Not on file  Food Insecurity: Not on file  Transportation Needs: Not on file  Physical Activity: Not on file  Stress: Not on file  Social Connections: Not on file  Intimate Partner Violence: Not on file    Family History  Problem Relation Age of Onset   COPD Mother    Emphysema Mother    Cancer Father    Kidney disease Son    Autism Son     The following portions of the patient's  history were reviewed and updated as appropriate: allergies, current medications, past family history, past medical history, past social history, past surgical history and problem list.  Review of Systems Review of Systems - Negative except as mentioned in HPI Review of Systems - General ROS: negative for - chills, fatigue, fever, malaise. Positive for hot flashes,  night sweats Hematological and Lymphatic ROS: negative for - bleeding problems or swollen lymph nodes Gastrointestinal ROS: negative for - abdominal pain, blood in stools, change in bowel habits and nausea/vomiting Musculoskeletal ROS: negative for - joint pain, muscle pain or muscular weakness Genito-Urinary ROS: negative for - change in menstrual cycle, dysmenorrhea,  dyspareunia, dysuria, genital discharge, genital ulcers, hematuria, incontinence, irregular/heavy menses, nocturia or pelvic pain.   Objective:   BP 111/78   Pulse 77   Ht 5\' 6"  (1.676 m)   Wt 190 lb 9.6 oz (86.5 kg)   LMP  (LMP Unknown)   BMI 30.76 kg/m  CONSTITUTIONAL: Well-developed, well-nourished female in no acute distress.  HENT:  Normocephalic, atraumatic.  NECK: Normal range of motion, supple, no masses.  Normal thyroid.  SKIN: Skin is warm and dry. No rash noted. Not diaphoretic. No erythema. No pallor. NEUROLGIC: Alert and oriented to person, place, and time. PSYCHIATRIC: Normal mood and affect. Normal behavior. Normal judgment and thought content. CARDIOVASCULAR:Not Examined RESPIRATORY: Not Examined BREASTS: Not Examined ABDOMEN: Soft, non distended; Non tender.  No Organomegaly. PELVIC:not indicated MUSCULOSKELETAL: Normal range of motion. No tenderness.  No cyanosis, clubbing, or edema.     Assessment:   Hot flashes  Plan:   Discussed treatment options. Reviewed self help measures, herbal supplements, diet, exercise, cessation of smoking. Discussed risks, and benefits of HRT. PT requesting HRT therapy . Orders place Estrace 1 mg PO q day.   Pt will follow up 4 wks  if medication not working well.   Face to face time 15 min.   Doreene Burke, CNM

## 2022-11-01 ENCOUNTER — Ambulatory Visit: Payer: BC Managed Care – PPO | Admitting: Internal Medicine

## 2022-11-07 ENCOUNTER — Encounter: Payer: Self-pay | Admitting: Certified Nurse Midwife

## 2022-11-07 ENCOUNTER — Other Ambulatory Visit: Payer: Self-pay | Admitting: Certified Nurse Midwife

## 2022-11-07 MED ORDER — ESTRADIOL 0.5 MG PO TABS: 1.5000 mg | ORAL_TABLET | Freq: Every day | ORAL | 0 refills | Status: DC

## 2022-11-07 NOTE — Progress Notes (Signed)
Pt states medication not working requesting increase in dose. Orders placed.   Doreene Burke, CNM

## 2022-11-19 ENCOUNTER — Inpatient Hospital Stay: Admission: RE | Admit: 2022-11-19 | Payer: BC Managed Care – PPO | Source: Ambulatory Visit

## 2022-12-06 ENCOUNTER — Other Ambulatory Visit: Payer: Self-pay | Admitting: Certified Nurse Midwife

## 2022-12-10 ENCOUNTER — Other Ambulatory Visit: Payer: Self-pay | Admitting: Certified Nurse Midwife

## 2022-12-10 NOTE — Telephone Encounter (Signed)
I contacted this patient via phone. She has confirmed appointment with [facility] for 01/13/23. Please advise refill request?

## 2022-12-23 ENCOUNTER — Other Ambulatory Visit: Payer: Self-pay | Admitting: Internal Medicine

## 2022-12-23 DIAGNOSIS — J45909 Unspecified asthma, uncomplicated: Secondary | ICD-10-CM

## 2022-12-24 ENCOUNTER — Other Ambulatory Visit: Payer: Self-pay | Admitting: Certified Nurse Midwife

## 2022-12-24 NOTE — Telephone Encounter (Signed)
Requested Prescriptions  Refused Prescriptions Disp Refills   montelukast (SINGULAIR) 10 MG tablet [Pharmacy Med Name: MONTELUKAST SOD 10 MG TABLET] 90 tablet 1    Sig: TAKE 1 TABLET BY MOUTH EVERYDAY AT BEDTIME     Pulmonology:  Leukotriene Inhibitors Passed - 12/23/2022  2:34 AM      Passed - Valid encounter within last 12 months    Recent Outpatient Visits           2 months ago Asthma due to environmental allergies   [facility] Margarita Mail, [place]

## 2023-01-13 ENCOUNTER — Ambulatory Visit: Payer: BC Managed Care – PPO | Admitting: Certified Nurse Midwife

## 2023-03-07 NOTE — Telephone Encounter (Signed)
Pt scheduled on 04/03/2023, Pt is requesting refill to cover until physical appt. Thank you

## 2023-04-03 ENCOUNTER — Ambulatory Visit: Payer: BC Managed Care – PPO | Admitting: Certified Nurse Midwife

## 2023-05-05 ENCOUNTER — Other Ambulatory Visit: Payer: Self-pay | Admitting: Certified Nurse Midwife

## 2023-05-06 ENCOUNTER — Other Ambulatory Visit: Payer: Self-pay | Admitting: Certified Nurse Midwife

## 2023-05-06 DIAGNOSIS — J45909 Unspecified asthma, uncomplicated: Secondary | ICD-10-CM

## 2023-05-06 MED ORDER — ALBUTEROL SULFATE HFA 108 (90 BASE) MCG/ACT IN AERS: 2.0000 | INHALATION_SPRAY | Freq: Four times a day (QID) | RESPIRATORY_TRACT | 2 refills | Status: DC | PRN

## 2023-06-03 ENCOUNTER — Telehealth: Payer: Self-pay | Admitting: Family Medicine

## 2023-06-03 DIAGNOSIS — B9689 Other specified bacterial agents as the cause of diseases classified elsewhere: Secondary | ICD-10-CM

## 2023-06-03 DIAGNOSIS — J019 Acute sinusitis, unspecified: Secondary | ICD-10-CM

## 2023-06-03 DIAGNOSIS — J4541 Moderate persistent asthma with (acute) exacerbation: Secondary | ICD-10-CM

## 2023-06-03 MED ORDER — PROMETHAZINE-DM 6.25-15 MG/5ML PO SYRP: 5.0000 mL | ORAL_SOLUTION | Freq: Four times a day (QID) | ORAL | 0 refills | Status: AC | PRN

## 2023-06-03 MED ORDER — PREDNISONE 20 MG PO TABS: 20.0000 mg | ORAL_TABLET | Freq: Two times a day (BID) | ORAL | 0 refills | Status: AC

## 2023-06-03 MED ORDER — AMOXICILLIN-POT CLAVULANATE 875-125 MG PO TABS: 1.0000 | ORAL_TABLET | Freq: Two times a day (BID) | ORAL | 0 refills | Status: DC

## 2023-06-03 NOTE — Patient Instructions (Signed)

## 2023-06-03 NOTE — Progress Notes (Signed)
 Virtual Visit Consent   PARISH AUGUSTINE, you are scheduled for a virtual visit with a [facility] provider today. Just as with appointments in the office, your consent must be obtained to participate. Your consent will be active for this visit and any virtual visit you may have with one of our providers in the next 365 days. If you have a MyChart account, a copy of this consent can be sent to you electronically.  As this is a virtual visit, video technology does not allow for your provider to perform a traditional examination. This may limit your provider's ability to fully assess your condition. If your provider identifies any concerns that need to be evaluated in person or the need to arrange testing (such as labs, EKG, etc.), we will make arrangements to do so. Although advances in technology are sophisticated, we cannot ensure that it will always work on either your end or our end. If the connection with a video visit is poor, the visit may have to be switched to a telephone visit. With either a video or telephone visit, we are not always able to ensure that we have a secure connection.  By engaging in this virtual visit, you consent to the provision of healthcare and authorize for your insurance to be billed (if applicable) for the services provided during this visit. Depending on your insurance coverage, you may receive a charge related to this service.  I need to obtain your verbal consent now. Are you willing to proceed with your visit today? Alexis Gilbert has provided verbal consent on 06/03/2023 for a virtual visit (video or telephone). Loa Lamp, FNP  Date: 06/03/2023 7:18 PM  Virtual Visit via Video Note   I, Loa Lamp, connected with  Alexis Gilbert  ([id number], May 05, 1971) on 06/03/23 at  7:30 PM EST by a video-enabled telemedicine application and verified that I am speaking with the correct person using two identifiers.  Location: Patient: Virtual Visit Location Patient:  Home Provider: Virtual Visit Location Provider: Home Office   I discussed the limitations of evaluation and management by telemedicine and the availability of in person appointments. The patient expressed understanding and agreed to proceed.    History of Present Illness: Alexis Gilbert is a 53 y.o. who identifies as a female who was assigned female at birth, and is being seen today for maxillary sinus pressure and pain, swelling over rt maxillary sinus, no fever, cough and wheezing, has used almost an entire albuterol  inhaler in 2 weeks. Sx for almost a month. She says the home she is renting has mold and no heat. She is in no acute distress. Alexis Gilbert  HPI: HPI  Problems:  Patient Active Problem List   Diagnosis Date Noted   Hot flashes 10/29/2022   Dysphagia 03/07/2018    Allergies:  Allergies  Allergen Reactions   Codeine Anaphylaxis   Hydrocodone-Acetaminophen  Swelling   Vicodin [place].[email or web address] ] Rash   Medications:  Current Outpatient Medications:    amoxicillin -clavulanate (AUGMENTIN ) 875-125 MG tablet, Take 1 tablet by mouth 2 (two) times daily., Disp: 20 tablet, Rfl: 0   predniSONE  (DELTASONE ) 20 MG tablet, Take 1 tablet (20 mg total) by mouth 2 (two) times daily with a meal for 5 days., Disp: 10 tablet, Rfl: 0   promethazine -dextromethorphan (PROMETHAZINE -DM) 6.25-15 MG/5ML syrup, Take 5 mLs by mouth 4 (four) times daily as needed for up to 10 days for cough., Disp: 118 mL, Rfl: 0   albuterol  (VENTOLIN  HFA) 108 (90 Base) MCG/ACT inhaler,  Inhale 2 puffs into the lungs every 6 (six) hours as needed for wheezing or shortness of breath., Disp: 8 g, Rfl: 2   benzonatate  (TESSALON ) 100 MG capsule, Take 1 capsule (100 mg total) by mouth 2 (two) times daily as needed for cough. (Patient not taking: Reported on 10/29/2022), Disp: 20 capsule, Rfl: 1   estradiol  (ESTRACE ) 0.5 MG tablet, Take 3 tablets (1.5 mg total) by mouth daily for 270 doses., Disp: 270 tablet, Rfl: 0    Fezolinetant  (VEOZAH ) 45 MG TABS, Take 1 tablet (45 mg total) by mouth daily. (Patient not taking: Reported on 10/29/2022), Disp: 30 tablet, Rfl: 0   montelukast  (SINGULAIR ) 10 MG tablet, Take 1 tablet (10 mg total) by mouth at bedtime. (Patient not taking: Reported on 10/29/2022), Disp: 30 tablet, Rfl: 3  Observations/Objective: Patient is well-developed, well-nourished in no acute distress.  Resting comfortably  at home.  Head is normocephalic, atraumatic.  No labored breathing.  Speech is clear and coherent with logical content.  Patient is alert and oriented at baseline.    Assessment and Plan: 1. Acute bacterial sinusitis (Primary)  2. Moderate persistent asthmatic bronchitis with acute exacerbation  Increase fluids, continue albuterol , flonase , UC if sx persist or wors  Follow Up Instructions: I discussed the assessment and treatment plan with the patient. The patient was provided an opportunity to ask questions and all were answered. The patient agreed with the plan and demonstrated an understanding of the instructions.  A copy of instructions were sent to the patient via MyChart unless otherwise noted below.     The patient was advised to call back or seek an in-person evaluation if the symptoms worsen or if the condition fails to improve as anticipated.    Marcheta Horsey, FNP

## 2023-06-17 ENCOUNTER — Encounter: Payer: Self-pay | Admitting: Internal Medicine

## 2023-06-17 ENCOUNTER — Other Ambulatory Visit: Payer: Self-pay

## 2023-06-17 ENCOUNTER — Ambulatory Visit (INDEPENDENT_AMBULATORY_CARE_PROVIDER_SITE_OTHER): Payer: Self-pay | Admitting: Internal Medicine

## 2023-06-17 VITALS — BP 124/78 | HR 78 | Temp 97.9°F | Resp 18 | Ht 66.5 in | Wt 198.3 lb

## 2023-06-17 DIAGNOSIS — J45909 Unspecified asthma, uncomplicated: Secondary | ICD-10-CM

## 2023-06-17 DIAGNOSIS — J0101 Acute recurrent maxillary sinusitis: Secondary | ICD-10-CM | POA: Diagnosis not present

## 2023-06-17 DIAGNOSIS — Z9109 Other allergy status, other than to drugs and biological substances: Secondary | ICD-10-CM

## 2023-06-17 MED ORDER — CETIRIZINE HCL 10 MG PO TABS: 10.0000 mg | ORAL_TABLET | Freq: Every day | ORAL | 11 refills | Status: AC

## 2023-06-17 MED ORDER — MONTELUKAST SODIUM 10 MG PO TABS: 10.0000 mg | ORAL_TABLET | Freq: Every day | ORAL | 3 refills | Status: AC

## 2023-06-17 MED ORDER — METHYLPREDNISOLONE 4 MG PO TBPK: ORAL_TABLET | ORAL | 0 refills | Status: DC

## 2023-06-17 MED ORDER — AMOXICILLIN-POT CLAVULANATE 875-125 MG PO TABS: 1.0000 | ORAL_TABLET | Freq: Two times a day (BID) | ORAL | 0 refills | Status: DC

## 2023-06-17 NOTE — Progress Notes (Signed)
Acute Office Visit  Subjective:     Patient ID: Alexis Gilbert, female    DOB: 01-Jun-1970, 53 y.o.   MRN: [id number]  Chief Complaint  Patient presents with   Sinusitis    Recurrent for 3 months    HPI Patient is in today for URI symptoms. Patient has black mold in the household and is unable to get her landlord to do anything about it. Symptoms started back in September.   URI Compliant:  -Fever: no -Cough: yes, sometimes productive, clear -Shortness of breath: yes -Wheezing: yes -Nasal congestion: yes -Runny nose: no -Post nasal drip: yes -Sore throat: no -Sinus pressure: yes -Headache: yes -Face pain: yes -Ear pain: yes "right -Ear pressure: yes "right -Sick contacts: no  -Context: worse -Recurrent sinusitis: yes -Relief with OTC cold/cough medications: no  -Treatments attempted: Alubterol, Singulair, Iburpofen     Review of Systems  Constitutional:  Negative for chills and fever.  HENT:  Positive for congestion, ear pain and sinus pain. Negative for sore throat.   Respiratory:  Positive for cough, sputum production, shortness of breath and wheezing.   Cardiovascular:  Negative for chest pain.  Neurological:  Positive for headaches.        Objective:    BP 124/78 (Cuff Size: Large)   Pulse 78   Temp 97.9 F (36.6 C) (Oral)   Resp 18   Ht 5' 6.5" (1.689 m)   Wt 198 lb 4.8 oz (89.9 kg)   LMP  (LMP Unknown)   SpO2 98%   BMI 31.53 kg/m  BP Readings from Last 3 Encounters:  06/17/23 124/78  10/29/22 111/78  09/27/22 116/72   Wt Readings from Last 3 Encounters:  06/17/23 198 lb 4.8 oz (89.9 kg)  10/29/22 190 lb 9.6 oz (86.5 kg)  09/27/22 191 lb 8 oz (86.9 kg)      Physical Exam Constitutional:      Appearance: Normal appearance.  HENT:     Head: Normocephalic and atraumatic.     Comments: Pain to palpation over bilateral maxillary sinuses     Right Ear: Ear canal and external ear normal.     Left Ear: Tympanic membrane, ear canal and  external ear normal.     Ears:     Comments: Right eustachian tube dysfunction present    Nose: Congestion present.     Mouth/Throat:     Mouth: Mucous membranes are moist.     Comments: Mild PND Eyes:     Conjunctiva/sclera: Conjunctivae normal.  Cardiovascular:     Rate and Rhythm: Normal rate and regular rhythm.  Pulmonary:     Effort: Pulmonary effort is normal.     Breath sounds: Normal breath sounds. No wheezing, rhonchi or rales.  Skin:    General: Skin is warm and dry.  Neurological:     General: No focal deficit present.     Mental Status: She is alert. Mental status is at baseline.  Psychiatric:        Mood and Affect: Mood normal.        Behavior: Behavior normal.     No results found for any visits on 06/17/23.      Assessment & Plan:   1. Acute recurrent maxillary sinusitis (Primary): Will treat with antibiotics and steroids for ongoing symptoms now, recommend she have a full exam by ENT to be sure chronic exposure to black mold is not causing a chronic sinusitis.   - amoxicillin-clavulanate (AUGMENTIN) 875-125 MG tablet; Take 1  tablet by mouth 2 (two) times daily.  Dispense: 20 tablet; Refill: 0 - methylPREDNISolone (MEDROL DOSEPAK) 4 MG TBPK tablet; .medr  Dispense: 1 each; Refill: 0 - Ambulatory referral to ENT  2. Environmental allergies/Asthma due to environmental allergies: Start oral anti-histamine, continue Singulair and Albuterol PRN.  - montelukast (SINGULAIR) 10 MG tablet; Take 1 tablet (10 mg total) by mouth at bedtime.  Dispense: 30 tablet; Refill: 3 - cetirizine (ZYRTEC) 10 MG tablet; Take 1 tablet (10 mg total) by mouth daily.  Dispense: 30 tablet; Refill: 11   Return in about 4 months (around 10/15/2023) for physical.  Margarita Mail, DO

## 2023-08-04 ENCOUNTER — Ambulatory Visit: Payer: Self-pay | Admitting: Certified Nurse Midwife

## 2023-08-06 ENCOUNTER — Encounter: Payer: Self-pay | Admitting: Certified Nurse Midwife

## 2023-08-15 ENCOUNTER — Encounter: Payer: Self-pay | Admitting: Internal Medicine

## 2023-08-15 ENCOUNTER — Other Ambulatory Visit: Payer: Self-pay

## 2023-08-15 ENCOUNTER — Ambulatory Visit (INDEPENDENT_AMBULATORY_CARE_PROVIDER_SITE_OTHER): Admitting: Internal Medicine

## 2023-08-15 VITALS — BP 120/80 | HR 86 | Temp 97.9°F | Resp 16 | Ht 66.5 in | Wt 198.3 lb

## 2023-08-15 DIAGNOSIS — Z0001 Encounter for general adult medical examination with abnormal findings: Secondary | ICD-10-CM

## 2023-08-15 DIAGNOSIS — Z0000 Encounter for general adult medical examination without abnormal findings: Secondary | ICD-10-CM

## 2023-08-15 DIAGNOSIS — Z122 Encounter for screening for malignant neoplasm of respiratory organs: Secondary | ICD-10-CM

## 2023-08-15 DIAGNOSIS — E669 Obesity, unspecified: Secondary | ICD-10-CM | POA: Diagnosis not present

## 2023-08-15 DIAGNOSIS — Z1231 Encounter for screening mammogram for malignant neoplasm of breast: Secondary | ICD-10-CM

## 2023-08-15 DIAGNOSIS — Z13220 Encounter for screening for lipoid disorders: Secondary | ICD-10-CM

## 2023-08-15 DIAGNOSIS — Z1211 Encounter for screening for malignant neoplasm of colon: Secondary | ICD-10-CM

## 2023-08-15 NOTE — Progress Notes (Signed)
 Name: Alexis Gilbert   MRN: [id number]    DOB: 02/11/1971   Date:08/15/2023       Progress Note  Subjective  Chief Complaint  Chief Complaint  Patient presents with   Annual Exam    HPI  Patient presents for annual CPE. She would also like to discuss weight loss medications.   Diet: regular  Exercise: 7 days a week very active at work, walking a lot  Last Eye Exam:  will schedule Last Dental Exam: completed  Flowsheet Row Office Visit from 08/15/2023 in [facility]  AUDIT-C Score 0      Depression: Phq 9 is  negative    08/15/2023   10:54 AM 09/27/2022   10:20 AM  Depression screen PHQ 2/9  Decreased Interest 0 0  Down, Depressed, Hopeless 0 0  PHQ - 2 Score 0 0  Altered sleeping  0  Tired, decreased energy  0  Change in appetite  0  Feeling bad or failure about yourself   0  Trouble concentrating  0  Moving slowly or fidgety/restless  0  Suicidal thoughts  0  PHQ-9 Score  0  Difficult doing work/chores  Not difficult at all   Hypertension: BP Readings from Last 3 Encounters:  08/15/23 120/80  06/17/23 124/78  10/29/22 111/78   Obesity: Wt Readings from Last 3 Encounters:  08/15/23 198 lb 4.8 oz (89.9 kg)  06/17/23 198 lb 4.8 oz (89.9 kg)  10/29/22 190 lb 9.6 oz (86.5 kg)   BMI Readings from Last 3 Encounters:  08/15/23 31.53 kg/m  06/17/23 31.53 kg/m  10/29/22 30.76 kg/m     Vaccines: Shingles reviewed with the patient.   Hep C Screening: completed STD testing and prevention (HIV/chl/gon/syphilis): no concerns Intimate partner violence: negative screen  LMP: hysterectomy in 2020 due to polyps, fibroids  Discussed importance of follow up if any post-menopausal bleeding: no  Incontinence Symptoms: negative for symptoms   Breast cancer:  - Last Mammogram: will order  Osteoporosis Prevention : Discussed high calcium and vitamin D supplementation, weight bearing exercises Bone density :not applicable   Cervical  cancer screening: up-to-date, history of hysterectomy    Skin cancer: Discussed monitoring for atypical lesions  Colorectal cancer: will order Cologuard Lung cancer:  Low Dose CT Chest recommended if Age 21-80 years, 20 pack-year currently smoking OR have quit w/in 15years. Patient does qualify for screen   ECG: 02/07/20  Advanced Care Planning: A voluntary discussion about advance care planning including the explanation and discussion of advance directives.  Discussed health care proxy and Living will, and the patient was able to identify a health care proxy as andelyn spade (son).  Patient does not have a living will and power of attorney of health care   Patient Active Problem List   Diagnosis Date Noted   Hot flashes 10/29/2022   Dysphagia 03/07/2018    Past Surgical History:  Procedure Laterality Date   ABDOMINAL HYSTERECTOMY     hemmorhoidectomy     HERNIA REPAIR     TUBAL LIGATION      Family History  Problem Relation Age of Onset   COPD Mother    Emphysema Mother    Cancer Father    Kidney disease Son    Autism Son     Social History   Socioeconomic History   Marital status: Legally Separated    Spouse name: Not on file   Number of children: Not on file   Years of  education: Not on file   Highest education level: Not on file  Occupational History   Not on file  Tobacco Use   Smoking status: Every Day    Current packs/day: 0.30    Types: Cigarettes   Smokeless tobacco: Never  Vaping Use   Vaping status: Never Used  Substance and Sexual Activity   Alcohol use: No   Drug use: Never   Sexual activity: Yes  Other Topics Concern   Not on file  Social History Narrative   Not on file   Social Drivers of Health   Financial Resource Strain: Low Risk  (08/15/2023)   Overall Financial Resource Strain (CARDIA)    Difficulty of Paying Living Expenses: Not hard at all  Food Insecurity: No Food Insecurity (08/15/2023)   Hunger Vital Sign    Worried About Running  Out of Food in the Last Year: Never true    Ran Out of Food in the Last Year: Never true  Transportation Needs: No Transportation Needs (08/15/2023)   PRAPARE - Administrator, Civil Service (Medical): No    Lack of Transportation (Non-Medical): No  Physical Activity: Sufficiently Active (08/15/2023)   Exercise Vital Sign    Days of Exercise per Week: 7 days    Minutes of Exercise per Session: 60 min  Stress: No Stress Concern Present (08/15/2023)   [employer] of Occupational Health - Occupational Stress Questionnaire    Feeling of Stress : Not at all  Social Connections: Socially Isolated (08/15/2023)   Social Connection and Isolation Panel [NHANES]    Frequency of Communication with Friends and Family: More than three times a week    Frequency of Social Gatherings with Friends and Family: More than three times a week    Attends Religious Services: Never    Database administrator or Organizations: No    Attends Banker Meetings: Never    Marital Status: Separated  Intimate Partner Violence: Not At Risk (08/15/2023)   Humiliation, Afraid, Rape, and Kick questionnaire    Fear of Current or Ex-Partner: No    Emotionally Abused: No    Physically Abused: No    Sexually Abused: No     Current Outpatient Medications:    albuterol (VENTOLIN HFA) 108 (90 Base) MCG/ACT inhaler, Inhale 2 puffs into the lungs every 6 (six) hours as needed for wheezing or shortness of breath., Disp: 8 g, Rfl: 2   cetirizine (ZYRTEC) 10 MG tablet, Take 1 tablet (10 mg total) by mouth daily., Disp: 30 tablet, Rfl: 11   estradiol (ESTRACE) 0.5 MG tablet, Take 3 tablets (1.5 mg total) by mouth daily for 270 doses., Disp: 270 tablet, Rfl: 0   montelukast (SINGULAIR) 10 MG tablet, Take 1 tablet (10 mg total) by mouth at bedtime., Disp: 30 tablet, Rfl: 3   amoxicillin-clavulanate (AUGMENTIN) 875-125 MG tablet, Take 1 tablet by mouth 2 (two) times daily. (Patient not taking: Reported on  08/15/2023), Disp: 20 tablet, Rfl: 0   methylPREDNISolone (MEDROL DOSEPAK) 4 MG TBPK tablet, .medr (Patient not taking: Reported on 08/15/2023), Disp: 1 each, Rfl: 0  Allergies  Allergen Reactions   Codeine Anaphylaxis   Hydrocodone-Acetaminophen Swelling   Vicodin [Hydrocodone-Acetaminophen] Rash     Review of Systems  All other systems reviewed and are negative.    Objective  Vitals:   08/15/23 1055  BP: 120/80  Pulse: 86  Resp: 16  Temp: 97.9 F (36.6 C)  TempSrc: Oral  Weight: 198 lb 4.8 oz (  89.9 kg)  Height: 5' 6.5" (1.689 m)    Body mass index is 31.53 kg/m.  Physical Exam Constitutional:      Appearance: Normal appearance.  HENT:     Head: Normocephalic and atraumatic.     Mouth/Throat:     Mouth: Mucous membranes are moist.     Pharynx: Oropharynx is clear.  Eyes:     Extraocular Movements: Extraocular movements intact.     Conjunctiva/sclera: Conjunctivae normal.     Pupils: Pupils are equal, round, and reactive to light.  Neck:     Comments: No thyromegaly Cardiovascular:     Rate and Rhythm: Normal rate and regular rhythm.  Pulmonary:     Effort: Pulmonary effort is normal.     Breath sounds: Normal breath sounds.  Musculoskeletal:     Cervical back: No tenderness.     Right lower leg: No edema.     Left lower leg: No edema.  Lymphadenopathy:     Cervical: No cervical adenopathy.  Skin:    General: Skin is warm and dry.  Neurological:     General: No focal deficit present.     Mental Status: She is alert. Mental status is at baseline.  Psychiatric:        Mood and Affect: Mood normal.        Behavior: Behavior normal.     Last CBC Lab Results  Component Value Date   WBC 5.6 09/27/2022   HGB 13.8 09/27/2022   HCT 41.4 09/27/2022   MCV 90.2 09/27/2022   MCH 30.1 09/27/2022   RDW 12.5 09/27/2022   PLT 183 09/27/2022   Last metabolic panel Lab Results  Component Value Date   GLUCOSE 97 09/27/2022   NA 139 09/27/2022   K 3.9  09/27/2022   CL 106 09/27/2022   CO2 27 09/27/2022   BUN 15 09/27/2022   CREATININE 0.89 09/27/2022   EGFR 79 09/27/2022   CALCIUM 9.6 09/27/2022   PROT 6.4 09/27/2022   BILITOT 0.3 09/27/2022   AST 10 09/27/2022   ALT 12 09/27/2022   ANIONGAP 6 03/12/2018   Last lipids Lab Results  Component Value Date   CHOL 163 09/27/2022   HDL 55 09/27/2022   LDLCALC 84 09/27/2022   TRIG 141 09/27/2022   CHOLHDL 3.0 09/27/2022   Last hemoglobin A1c Lab Results  Component Value Date   HGBA1C 5.4 03/08/2018   Last thyroid functions No results found for: "TSH", "T3TOTAL", "T4TOTAL", "THYROIDAB" Last vitamin D No results found for: "25OHVITD2", "25OHVITD3", "VD25OH" Last vitamin B12 and Folate No results found for: "VITAMINB12", "FOLATE"    Assessment & Plan  1. Annual physical exam (Primary)/Lipid screening: Physical exam completed, health maintenance reviewed and annual labs ordered.    - Lipid Profile - CBC w/Diff/Platelet - COMPLETE METABOLIC PANEL WITH GFR - HgB Y8M  2. Encounter for screening mammogram for malignant neoplasm of breast: Mammogram ordered.   - MM 3D SCREENING MAMMOGRAM BILATERAL BREAST; Future  3. Screening for colon cancer: Cologuard ordered.   - Cologuard  4. Screening for lung cancer: Referral placed for lung cancer screening.   - Ambulatory Referral Lung Cancer Screening [facility]  5. Obesity (BMI 30-39.9): Discussed GLP-1 therapy at length, no personal or family history of pancreatitis or medullary thyroid carcinoma. Discussed potential side effects with the patient, will prescribe the medication and follow up in 3-4 weeks after starting the medication.   -USPSTF grade A and B recommendations reviewed with patient; age-appropriate recommendations, preventive  care, screening tests, etc discussed and encouraged; healthy living encouraged; see AVS for patient education given to patient -Discussed importance of 150 minutes of physical activity  weekly, eat two servings of fish weekly, eat one serving of tree nuts ( cashews, pistachios, pecans, almonds.Marland Kitchen) every other day, eat 6 servings of fruit/vegetables daily and drink plenty of water and avoid sweet beverages.   -Reviewed Health Maintenance: Yes.

## 2023-08-16 LAB — LIPID PANEL
Cholesterol: 167 mg/dL (ref <200)
HDL: 46 mg/dL — ABNORMAL LOW (ref >=50)
LDL Cholesterol (Calc): 98 mg/dL
Non-HDL Cholesterol (Calc): 121 mg/dL (ref <130)
Total CHOL/HDL Ratio: 3.6 (calc) (ref <5.0)
Triglycerides: 135 mg/dL (ref <150)

## 2023-08-16 LAB — COMPLETE METABOLIC PANEL WITH GFR
AG Ratio: 2.1 (calc) (ref 1.0–2.5)
ALT: 15 U/L (ref 6–29)
AST: 14 U/L (ref 10–35)
Albumin: 3.7 g/dL (ref 3.6–5.1)
Alkaline phosphatase (APISO): 61 U/L (ref 37–153)
BUN: 13 mg/dL (ref 7–25)
CO2: 28 mmol/L (ref 20–32)
Calcium: 9.1 mg/dL (ref 8.6–10.4)
Chloride: 107 mmol/L (ref 98–110)
Creat: 1.01 mg/dL (ref 0.50–1.03)
Globulin: 1.8 g/dL — ABNORMAL LOW (ref 1.9–3.7)
Glucose, Bld: 97 mg/dL (ref 65–99)
Potassium: 4.2 mmol/L (ref 3.5–5.3)
Sodium: 141 mmol/L (ref 135–146)
Total Bilirubin: 0.7 mg/dL (ref 0.2–1.2)
Total Protein: 5.5 g/dL — ABNORMAL LOW (ref 6.1–8.1)

## 2023-08-16 LAB — CBC WITH DIFFERENTIAL/PLATELET
Absolute Lymphocytes: 2134 {cells}/uL (ref 850–3900)
Absolute Monocytes: 479 {cells}/uL (ref 200–950)
Basophils Absolute: 50 {cells}/uL (ref 0–200)
Basophils Relative: 0.9 %
Eosinophils Absolute: 143 {cells}/uL (ref 15–500)
Eosinophils Relative: 2.6 %
HCT: 43.7 % (ref 35.0–45.0)
Hemoglobin: 14.6 g/dL (ref 11.7–15.5)
MCH: 30 pg (ref 27.0–33.0)
MCHC: 33.4 g/dL (ref 32.0–36.0)
MCV: 89.9 fL (ref 80.0–100.0)
MPV: 10.9 fL (ref 7.5–12.5)
Monocytes Relative: 8.7 %
Neutro Abs: 2695 {cells}/uL (ref 1500–7800)
Neutrophils Relative %: 49 %
Platelets: 191 10*3/uL (ref 140–400)
RBC: 4.86 10*6/uL (ref 3.80–5.10)
RDW: 12.5 % (ref 11.0–15.0)
Total Lymphocyte: 38.8 %
WBC: 5.5 10*3/uL (ref 3.8–10.8)

## 2023-08-16 LAB — HEMOGLOBIN A1C
Hgb A1c MFr Bld: 5.5 %{Hb} (ref <5.7)
Mean Plasma Glucose: 111 mg/dL
eAG (mmol/L): 6.2 mmol/L

## 2023-08-18 ENCOUNTER — Other Ambulatory Visit: Payer: Self-pay | Admitting: Internal Medicine

## 2023-08-18 ENCOUNTER — Encounter: Payer: Self-pay | Admitting: Internal Medicine

## 2023-08-18 DIAGNOSIS — E669 Obesity, unspecified: Secondary | ICD-10-CM

## 2023-08-18 MED ORDER — ZEPBOUND 2.5 MG/0.5ML ~~LOC~~ SOAJ: 2.5000 mg | SUBCUTANEOUS | 0 refills | Status: DC

## 2023-08-19 ENCOUNTER — Telehealth: Payer: Self-pay | Admitting: Internal Medicine

## 2023-08-19 ENCOUNTER — Other Ambulatory Visit: Payer: Self-pay | Admitting: Internal Medicine

## 2023-08-19 DIAGNOSIS — E669 Obesity, unspecified: Secondary | ICD-10-CM

## 2023-08-19 MED ORDER — WEGOVY 0.25 MG/0.5ML ~~LOC~~ SOAJ: 0.2500 mg | SUBCUTANEOUS | 0 refills | Status: DC

## 2023-08-19 NOTE — Telephone Encounter (Signed)
 Copied from CRM [id number]. Topic: General - Other >> Aug 19, 2023  4:38 PM Fuller Mandril wrote: Reason for CRM: Patient called to see if form was faxed back to her job with documentation of Lipids for insurance. She brought the form to last appt to be complete. Note under labs says for completed and faxed. Was unsure if this was for the paperwork for the job or the prior authorization. Thank You

## 2023-08-20 NOTE — Telephone Encounter (Signed)
 Paperwork was faxed back on 03/25 to number on paperwork

## 2023-08-20 NOTE — Telephone Encounter (Signed)
 Requested medication (s) are due for refill today: No  Requested medication (s) are on the active medication list: Yes  Last refill:  08/19/23  Future visit scheduled:   Notes to clinic:  See pharmacy request.    Requested Prescriptions  Pending Prescriptions Disp Refills   WEGOVY 0.25 MG/0.5ML SOAJ [Pharmacy Med Name: WEGOVY 0.25 MG/0.5 ML PEN]  0    Sig: INJECT 0.25MG  INTO THE SKIN ONE TIME PER WEEK     There is no refill protocol information for this order

## 2023-08-21 NOTE — Telephone Encounter (Signed)
 Spoke to patient and she gave a new ID number [id number] and RX [phone]

## 2023-08-21 NOTE — Telephone Encounter (Signed)
 Tried to put a PA in through Cover My Meds and it stated no patient is found

## 2023-08-26 ENCOUNTER — Telehealth: Payer: Self-pay

## 2023-08-26 NOTE — Telephone Encounter (Unsigned)
 Copied from CRM [id number]. Topic: Referral - Prior Authorization Question >> Aug 26, 2023  2:05 PM Ivette P wrote: Reason for CRM: PT called in to speak to [facility] about a current PA that is on going. Per the notes Riesa Pope RMA is on the PS called CAL spoke to [facility] and was told to send CRM for callback    Pt callback [phone]

## 2023-08-26 NOTE — Telephone Encounter (Signed)
 All paperwork and records was faxed back on 3/25. Waiting on response back from insurance

## 2023-08-28 NOTE — Telephone Encounter (Signed)
 Information sent for PA. Waiting on response

## 2023-09-01 ENCOUNTER — Telehealth: Payer: Self-pay

## 2023-09-01 NOTE — Telephone Encounter (Unsigned)
 Copied from CRM [id number]. Topic: Clinical - Prescription Issue >> Sep 01, 2023  3:24 PM Clayton Bibles wrote: Reason for CRM:  Attn: Myriam Jacobson She is having issues with getting Semaglutide-Weight Management (WEGOVY) 0.25 MG/0.5ML SOAJ filled. Please call [facility] at [phone]. She wants to tell you what needs to done so medication will be approved.

## 2023-09-02 ENCOUNTER — Telehealth: Payer: Self-pay | Admitting: Internal Medicine

## 2023-09-02 NOTE — Telephone Encounter (Signed)
 PA submitted through [email or web address]

## 2023-09-02 NOTE — Telephone Encounter (Signed)
errenous °

## 2023-09-26 ENCOUNTER — Ambulatory Visit: Admitting: Internal Medicine

## 2023-09-30 ENCOUNTER — Other Ambulatory Visit: Payer: Self-pay

## 2023-09-30 ENCOUNTER — Ambulatory Visit

## 2023-09-30 ENCOUNTER — Telehealth (INDEPENDENT_AMBULATORY_CARE_PROVIDER_SITE_OTHER): Admitting: Internal Medicine

## 2023-09-30 VITALS — Ht 66.5 in | Wt 199.3 lb

## 2023-09-30 DIAGNOSIS — Z713 Dietary counseling and surveillance: Secondary | ICD-10-CM | POA: Diagnosis not present

## 2023-09-30 DIAGNOSIS — E669 Obesity, unspecified: Secondary | ICD-10-CM

## 2023-09-30 DIAGNOSIS — K219 Gastro-esophageal reflux disease without esophagitis: Secondary | ICD-10-CM

## 2023-09-30 MED ORDER — OMEPRAZOLE 40 MG PO CPDR: 40.0000 mg | DELAYED_RELEASE_CAPSULE | Freq: Every day | ORAL | 3 refills | Status: AC

## 2023-09-30 MED ORDER — WEGOVY 0.5 MG/0.5ML ~~LOC~~ SOAJ: 0.5000 mg | SUBCUTANEOUS | 2 refills | Status: DC

## 2023-09-30 NOTE — Progress Notes (Signed)
 Virtual Visit via Video Note  I connected with Alexis Gilbert on 09/30/23 at 10:40 AM EDT by a video enabled telemedicine application and verified that I am speaking with the correct person using two identifiers.  Location: Patient: Home Provider: [facility]   I discussed the limitations of evaluation and management by telemedicine and the availability of in person appointments. The patient expressed understanding and agreed to proceed.  History of Present Illness:  Patient is presenting via telemedicine to discuss Wegovy .  Discussed the use of AI scribe software for clinical note transcription with the patient, who gave verbal consent to proceed.  History of Present Illness Alexis Gilbert is a 53 year old female who presents for a weight check and management of medication side effects.  She is undergoing a weight check for insurance documentation related to her medication regimen. She is on a starter dose of 0.25 mg of a weight management medication and has completed four doses without weight changes. She experiences significant heartburn since starting the medication, describing it as severe. She has a history of heartburn managed with over-the-counter omeprazole at 20 mg and prefers to resume it at a higher dose due to symptom severity. No severe abdominal pain is present.    Observations/Objective:  General: well appearing, no acute distress ENT: conjunctiva normal appearing bilaterally  Skin: no rashes, cyanosis or abnormal bruising noted Neuro: answers all questions appropriately   Assessment and Plan:  Assessment & Plan Gastroesophageal reflux disease (GERD) Severe heartburn worsened with new medication. Omeprazole 40 mg prescribed due to severity. - Prescribe omeprazole 40 mg as needed. - Send prescription to [facility] on [address]. - Monitor symptoms and adjust medication as needed.  Weight management No weight loss after initial 0.25 mg dose. Increase to 0.5 mg  planned. Side effects may worsen but should improve. Emphasized monitoring for severe abdominal pain. - Increase medication dose to 0.5 mg. - Document weight for insurance prior authorization. - Monitor for severe abdominal pain and report if it occurs. - Reassess weight and medication tolerance in three months.  - Semaglutide -Weight Management (WEGOVY ) 0.5 MG/0.5ML SOAJ; Inject 0.5 mg into the skin once a week.  Dispense: 2 mL; Refill: 2 - omeprazole (PRILOSEC) 40 MG capsule; Take 1 capsule (40 mg total) by mouth daily.  Dispense: 30 capsule; Refill: 3   Follow Up Instructions: 3 months     I discussed the assessment and treatment plan with the patient. The patient was provided an opportunity to ask questions and all were answered. The patient agreed with the plan and demonstrated an understanding of the instructions.   The patient was advised to call back or seek an in-person evaluation if the symptoms worsen or if the condition fails to improve as anticipated.  I provided 7 minutes of non-face-to-face time during this encounter.   Rockney Cid, DO

## 2023-11-07 ENCOUNTER — Encounter: Payer: Self-pay | Admitting: Internal Medicine

## 2023-11-10 ENCOUNTER — Other Ambulatory Visit: Payer: Self-pay | Admitting: Internal Medicine

## 2023-11-10 DIAGNOSIS — E669 Obesity, unspecified: Secondary | ICD-10-CM

## 2023-11-10 MED ORDER — WEGOVY 1 MG/0.5ML ~~LOC~~ SOAJ: 1.0000 mg | SUBCUTANEOUS | 1 refills | Status: DC

## 2023-12-04 ENCOUNTER — Other Ambulatory Visit: Payer: Self-pay

## 2023-12-04 ENCOUNTER — Emergency Department

## 2023-12-04 ENCOUNTER — Emergency Department
Admission: EM | Admit: 2023-12-04 | Discharge: 2023-12-04 | Disposition: A | Discharge status: Home / Self Care | Attending: Emergency Medicine | Admitting: Emergency Medicine

## 2023-12-04 DIAGNOSIS — R110 Nausea: Secondary | ICD-10-CM | POA: Diagnosis not present

## 2023-12-04 DIAGNOSIS — R109 Unspecified abdominal pain: Secondary | ICD-10-CM | POA: Diagnosis present

## 2023-12-04 DIAGNOSIS — R1011 Right upper quadrant pain: Secondary | ICD-10-CM | POA: Insufficient documentation

## 2023-12-04 DIAGNOSIS — J45909 Unspecified asthma, uncomplicated: Secondary | ICD-10-CM | POA: Insufficient documentation

## 2023-12-04 DIAGNOSIS — R1013 Epigastric pain: Secondary | ICD-10-CM | POA: Diagnosis not present

## 2023-12-04 DIAGNOSIS — F17200 Nicotine dependence, unspecified, uncomplicated: Secondary | ICD-10-CM | POA: Diagnosis not present

## 2023-12-04 DIAGNOSIS — R1010 Upper abdominal pain, unspecified: Secondary | ICD-10-CM

## 2023-12-04 LAB — COMPREHENSIVE METABOLIC PANEL WITH GFR
ALT: 17 U/L (ref 0–44)
AST: 17 U/L (ref 15–41)
Albumin: 4.2 g/dL (ref 3.5–5.0)
Alkaline Phosphatase: 80 U/L (ref 38–126)
Anion gap: 11 (ref 5–15)
BUN: 19 mg/dL (ref 6–20)
CO2: 21 mmol/L — ABNORMAL LOW (ref 22–32)
Calcium: 9.5 mg/dL (ref 8.9–10.3)
Chloride: 107 mmol/L (ref 98–111)
Creatinine, Ser: 0.97 mg/dL (ref 0.44–1.00)
GFR, Estimated: >60 mL/min (ref >60)
Glucose, Bld: 101 mg/dL — ABNORMAL HIGH (ref 70–99)
Potassium: 4 mmol/L (ref 3.5–5.1)
Sodium: 139 mmol/L (ref 135–145)
Total Bilirubin: 0.8 mg/dL (ref 0.0–1.2)
Total Protein: 7.2 g/dL (ref 6.5–8.1)

## 2023-12-04 LAB — TROPONIN I (HIGH SENSITIVITY): Troponin I (High Sensitivity): <2 ng/L (ref <18)

## 2023-12-04 LAB — CBC
HCT: 45.6 % (ref 36.0–46.0)
Hemoglobin: 15 g/dL (ref 12.0–15.0)
MCH: 29.2 pg (ref 26.0–34.0)
MCHC: 32.9 g/dL (ref 30.0–36.0)
MCV: 88.9 fL (ref 80.0–100.0)
Platelets: 212 K/uL (ref 150–400)
RBC: 5.13 MIL/uL — ABNORMAL HIGH (ref 3.87–5.11)
RDW: 12.5 % (ref 11.5–15.5)
WBC: 7.4 K/uL (ref 4.0–10.5)
nRBC: 0 % (ref 0.0–0.2)

## 2023-12-04 LAB — URINALYSIS, ROUTINE W REFLEX MICROSCOPIC
Bilirubin Urine: NEGATIVE
Glucose, UA: NEGATIVE mg/dL
Ketones, ur: NEGATIVE mg/dL
Nitrite: NEGATIVE
Protein, ur: NEGATIVE mg/dL
Specific Gravity, Urine: 1.017 (ref 1.005–1.030)
pH: 5 (ref 5.0–8.0)

## 2023-12-04 LAB — LIPASE, BLOOD: Lipase: 40 U/L (ref 11–51)

## 2023-12-04 MED ORDER — ONDANSETRON HCL 4 MG/2ML IJ SOLN
4.0000 mg | Freq: Once | INTRAMUSCULAR | Status: AC
  Administered 2023-12-04: 4 mg via INTRAVENOUS
  Filled 2023-12-04: qty 2

## 2023-12-04 MED ORDER — MORPHINE SULFATE (PF) 4 MG/ML IV SOLN
4.0000 mg | Freq: Once | INTRAVENOUS | Status: AC
  Administered 2023-12-04: 4 mg via INTRAVENOUS
  Filled 2023-12-04: qty 1

## 2023-12-04 MED ORDER — ONDANSETRON 4 MG PO TBDP
4.0000 mg | ORAL_TABLET | Freq: Three times a day (TID) | ORAL | 0 refills | Status: AC | PRN
Start: 2023-12-04 — End: ?

## 2023-12-04 MED ORDER — OXYCODONE HCL 5 MG PO TABS: 5.0000 mg | ORAL_TABLET | Freq: Four times a day (QID) | ORAL | 0 refills | Status: AC | PRN

## 2023-12-04 NOTE — ED Triage Notes (Addendum)
 Pt presents via POV c/o abd pain starting apx 3hr PTA. Reports nausea. Pt reports she is on a GLP1.

## 2023-12-04 NOTE — ED Provider Notes (Signed)
 [facility] Provider Note    None    (approximate)   History   Abdominal Pain   HPI  Alexis Gilbert is a 53 y.o. female   presents to the ED with complaint of abdominal pain that started approximately 3 hours prior to arrival.  Patient reports nausea but no vomiting or diarrhea.  She is unaware of any fever.  No sick contacts or family members with similar symptoms.  Patient had a bowel movement yesterday which was normal.  Patient currently is on a GLP-1 for 1 year, history of smoking and has asthma.      Physical Exam   Triage Vital Signs: ED Triage Vitals  Encounter Vitals Group     BP 12/04/23 0511 110/80     Girls Systolic BP Percentile --      Girls Diastolic BP Percentile --      Boys Systolic BP Percentile --      Boys Diastolic BP Percentile --      Pulse Rate 12/04/23 0511 91     Resp 12/04/23 0511 18     Temp 12/04/23 0511 97.7 F (36.5 C)     Temp Source 12/04/23 0511 Oral     SpO2 12/04/23 0511 96 %     Weight 12/04/23 0512 200 lb (90.7 kg)     Height 12/04/23 0512 5' 6 (1.676 m)     Head Circumference --      Peak Flow --      Pain Score 12/04/23 0512 10     Pain Loc --      Pain Education --      Exclude from Growth Chart --     Most recent vital signs: Vitals:   12/04/23 0832 12/04/23 0920  BP: 112/78 112/80  Pulse: 88 80  Resp: 18 18  Temp:  98 F (36.7 C)  SpO2: 97% 97%     General: Awake, no distress.  Appears to be uncomfortable. CV:  Good peripheral perfusion.  Heart regular rate rhythm. Resp:  Normal effort.  Lungs clear bilaterally. Abd:  No distention.  Soft, epigastric and right upper quadrant tenderness on palpation.  Bowel sounds normoactive x 4 quadrants.  No referred pain or right lower quadrant pain. Other:     ED Results / Procedures / Treatments   Labs (all labs ordered are listed, but only abnormal results are displayed) Labs Reviewed  COMPREHENSIVE METABOLIC PANEL WITH GFR - Abnormal;  Notable for the following components:      Result Value   CO2 21 (*)    Glucose, Bld 101 (*)    All other components within normal limits  CBC - Abnormal; Notable for the following components:   RBC 5.13 (*)    All other components within normal limits  URINALYSIS, ROUTINE W REFLEX MICROSCOPIC - Abnormal; Notable for the following components:   Color, Urine YELLOW (*)    APPearance CLEAR (*)    Hgb urine dipstick SMALL (*)    Leukocytes,Ua SMALL (*)    Bacteria, UA RARE (*)    All other components within normal limits  LIPASE, BLOOD  TROPONIN I (HIGH SENSITIVITY)  TROPONIN I (HIGH SENSITIVITY)      RADIOLOGY Ultrasound right upper quadrant limited per radiology:  No evidence of cholelithiasis, cholecystitis or biliary  obstruction.  2. Increased hepatic echogenicity, most consistent with steatosis.     Chest x-ray images were reviewed and interpreted by myself independent of the radiologist and was  negative for acute cardiopulmonary findings.  Radiology report agrees.  EKG  Vent. rate 67 BPM PR interval 182 ms QRS duration 80 ms QT/QTcB 398/420 ms P-R-T axes 75 36 57 Normal sinus rhythm Normal ECG When compared with ECG of 07-Feb-2020 18:54, No significant change was found    PROCEDURES:  Critical Care performed:   Procedures   MEDICATIONS ORDERED IN ED: Medications  ondansetron  (ZOFRAN ) injection 4 mg (4 mg Intravenous Given 12/04/23 0744)  morphine  (PF) 4 MG/ML injection 4 mg (4 mg Intravenous Given 12/04/23 0744)  morphine  (PF) 4 MG/ML injection 4 mg (4 mg Intravenous Given 12/04/23 0950)     IMPRESSION / MDM / ASSESSMENT AND PLAN / ED COURSE  I reviewed the triage vital signs and the nursing notes.   Differential diagnosis includes, but is not limited to, nausea secondary to GLP-1, gastritis, cholelithiasis, viral illness, acute urinary tract infection, pancreatitis, chest pain, cardiac episode.  53 year old female presents to the ED with complaint  of epigastric and right upper quadrant pain with vomiting that occurred this morning prior to arrival.  Patient has been on a GLP-1 medication for the past year and this week medication strength was increased.  Patient has experienced nausea since the increase of the medication but this morning experienced some abdominal pain.  Basic lab work is reassuring along with lipase, troponin and urinalysis.  Chest x-ray, EKG and ultrasound were also discussed with patient.  These were all reassuring.  A prescription for Zofran  was sent to the pharmacy for her to begin taking.  Patient was improved with decreased nausea and pain prior to discharge.  Patient is to call and talk with her primary care provider for follow-up about her GLP-1 medication and also make an appointment with Dr. Maryruth who is on-call for gastroenterology for further evaluation of her abdominal pain.  She was made aware that she should return to the emergency department if any severe worsening of her symptoms or urgent concerns.     Patient's presentation is most consistent with acute illness / injury with system symptoms.  FINAL CLINICAL IMPRESSION(S) / ED DIAGNOSES   Final diagnoses:  Nausea  Pain of upper abdomen     Rx / DC Orders   ED Discharge Orders          Ordered    ondansetron  (ZOFRAN -ODT) 4 MG disintegrating tablet  Every 8 hours PRN        12/04/23 1134    oxyCODONE  (OXY IR/ROXICODONE ) 5 MG immediate release tablet  Every 6 hours PRN        12/04/23 1134             Note:  This document was prepared using Dragon voice recognition software and may include unintentional dictation errors.   Saunders Shona CROME, PA-C 12/04/23 1442    Suzanne Kirsch, MD 12/04/23 1534

## 2023-12-04 NOTE — ED Notes (Signed)
 See triage note Presents with epigastric pain  States pain is severe and non radiating   Positive nausea  No vomiting or fever  States she went up on her GLP1 dose

## 2023-12-04 NOTE — Discharge Instructions (Addendum)
 Follow-up with your primary care provider and let them know that you got nausea with the increase of Wegovy .  If the nausea continues it may be necessary for them to decrease your dosage of Wegovy  as you are not tolerating it.  A prescription for Zofran  and oxycodone  was sent to the pharmacy.  Zofran  as needed for nausea.  Also call and make an appointment with the gastroenterologist listed on your discharge papers for further evaluation of your abdominal pain if this continues.

## 2023-12-19 ENCOUNTER — Encounter: Payer: Self-pay | Admitting: Certified Nurse Midwife

## 2023-12-19 ENCOUNTER — Telehealth: Admitting: Certified Nurse Midwife

## 2023-12-19 ENCOUNTER — Other Ambulatory Visit: Payer: Self-pay | Admitting: Internal Medicine

## 2023-12-19 DIAGNOSIS — F17210 Nicotine dependence, cigarettes, uncomplicated: Secondary | ICD-10-CM | POA: Diagnosis not present

## 2023-12-19 DIAGNOSIS — Z79890 Hormone replacement therapy (postmenopausal): Secondary | ICD-10-CM | POA: Diagnosis not present

## 2023-12-19 DIAGNOSIS — Z79899 Other long term (current) drug therapy: Secondary | ICD-10-CM

## 2023-12-19 DIAGNOSIS — K219 Gastro-esophageal reflux disease without esophagitis: Secondary | ICD-10-CM

## 2023-12-19 DIAGNOSIS — Z09 Encounter for follow-up examination after completed treatment for conditions other than malignant neoplasm: Secondary | ICD-10-CM | POA: Diagnosis not present

## 2023-12-19 MED ORDER — ESTRADIOL 0.0375 MG/24HR TD PTWK: 0.0375 mg | MEDICATED_PATCH | TRANSDERMAL | 12 refills | Status: DC

## 2023-12-19 MED ORDER — ESTRADIOL 0.0375 MG/24HR TD PTWK: 0.0375 mg | MEDICATED_PATCH | TRANSDERMAL | 12 refills | Status: AC

## 2023-12-19 NOTE — Progress Notes (Signed)
 Virtual Visit via Telephone Note  I connected with Alexis Gilbert on 12/19/23 at  8:55 AM EDT by telephone and verified that I am speaking with the correct person using two identifiers. Attempted to do video visit however, pt having computer issues.   Location: Patient: at work Provider: at office   I discussed the limitations, risks, security and privacy concerns of performing an evaluation and management service by telephone and the availability of in person appointments. I also discussed with the patient that there may be a patient responsible charge related to this service. The patient expressed understanding and agreed to proceed.   History of Present Illness: Started on oral estradiol  daily for hot flashes. She is following up today   Observations/Objective: Pt states she is having a hard time remembering to take the tablet which is resulting in non compliance therefore her flashes have gotten works.   Assessment and Plan: She would like to switch to the once weekly patch.   Follow Up Instructions: Climara  0.0375 mg/ 24 hrs ordered. Pt states she has cut back on her smoking . She state she smokes 5-6 cigarettes a day. Reviewed the risks of estrogen use with smoking. She verbalizes understanding and request to continue with estrogen use.    I discussed the assessment and treatment plan with the patient. The patient was provided an opportunity to ask questions and all were answered. The patient agreed with the plan and demonstrated an understanding of the instructions.   The patient was advised to call back or seek an in-person evaluation if the symptoms worsen or if the condition fails to improve as anticipated.  I provided 7 minutes of non-face-to-face time during this encounter.   Zelda Hummer, CNM

## 2023-12-22 NOTE — Telephone Encounter (Signed)
 Requested Prescriptions  Refused Prescriptions Disp Refills   omeprazole  (PRILOSEC) 40 MG capsule [Pharmacy Med Name: OMEPRAZOLE  DR 40 MG CAPSULE] 90 capsule 1    Sig: TAKE 1 CAPSULE (40 MG TOTAL) BY MOUTH DAILY.     Gastroenterology: Proton Pump Inhibitors Passed - 12/22/2023 11:06 AM      Passed - Valid encounter within last 12 months    Recent Outpatient Visits           2 months ago Obesity (BMI 30-39.9)   [facility] Bernardo Fend, DO   4 months ago Annual physical exam   [facility] Bernardo Fend, [place]

## 2023-12-31 ENCOUNTER — Ambulatory Visit: Admitting: Internal Medicine

## 2024-02-06 ENCOUNTER — Other Ambulatory Visit: Payer: Self-pay | Admitting: Certified Nurse Midwife

## 2024-02-06 DIAGNOSIS — J45909 Unspecified asthma, uncomplicated: Secondary | ICD-10-CM

## 2024-02-08 ENCOUNTER — Other Ambulatory Visit: Payer: Self-pay | Admitting: Internal Medicine

## 2024-02-08 ENCOUNTER — Ambulatory Visit: Payer: Self-pay

## 2024-02-08 DIAGNOSIS — E669 Obesity, unspecified: Secondary | ICD-10-CM

## 2024-02-09 ENCOUNTER — Ambulatory Visit
Admission: RE | Admit: 2024-02-09 | Discharge: 2024-02-09 | Disposition: A | Discharge status: Home / Self Care | Payer: Self-pay | Attending: Emergency Medicine | Admitting: Emergency Medicine

## 2024-02-09 VITALS — BP 130/83 | HR 81 | Temp 97.7°F | Resp 19

## 2024-02-09 DIAGNOSIS — J45901 Unspecified asthma with (acute) exacerbation: Secondary | ICD-10-CM | POA: Diagnosis not present

## 2024-02-09 DIAGNOSIS — J0100 Acute maxillary sinusitis, unspecified: Secondary | ICD-10-CM

## 2024-02-09 MED ORDER — PREDNISONE 10 MG PO TABS: 40.0000 mg | ORAL_TABLET | Freq: Every day | ORAL | 0 refills | Status: AC

## 2024-02-09 MED ORDER — AMOXICILLIN-POT CLAVULANATE 875-125 MG PO TABS: 1.0000 | ORAL_TABLET | Freq: Two times a day (BID) | ORAL | 0 refills | Status: DC

## 2024-02-09 MED ORDER — BENZONATATE 100 MG PO CAPS: 100.0000 mg | ORAL_CAPSULE | Freq: Three times a day (TID) | ORAL | 0 refills | Status: DC | PRN

## 2024-02-09 NOTE — ED Triage Notes (Signed)
 Patient to Urgent Care with complaints of cough/ drainage/ nasal congestion/ left sided ear pain that radiates into her neck.  Symptoms x10 days.   Meds: no otc

## 2024-02-09 NOTE — ED Provider Notes (Signed)
 Alexis Gilbert    CSN: [id number] Arrival date & time: 02/09/24  1531      History   Chief Complaint Chief Complaint  Patient presents with   Cough    Alot of drainage and cough and swollen adnoids - Entered by patient    HPI Alexis Gilbert is a 53 y.o. female.  Patient presents with 10-day history of ear pain, sinus congestion, postnasal drip, cough.  She reports some wheezing and shortness of breath and has needed to use her albuterol  inhaler more often.  No fever, chills, vomiting, diarrhea.  No OTC medications taken.  The history is provided by the patient and medical records.    Past Medical History:  Diagnosis Date   Asthma     Patient Active Problem List   Diagnosis Date Noted   Hot flashes 10/29/2022   Dysphagia 03/07/2018    Past Surgical History:  Procedure Laterality Date   ABDOMINAL HYSTERECTOMY     hemmorhoidectomy     HERNIA REPAIR     TUBAL LIGATION      OB History   No obstetric history on file.      Home Medications    Prior to Admission medications   Medication Sig Start Date End Date Taking? Authorizing Provider  benzonatate  (TESSALON ) 100 MG capsule Take 1 capsule (100 mg total) by mouth 3 (three) times daily as needed for cough. 02/09/24  Yes Corlis Burnard DEL, NP  albuterol  (VENTOLIN  HFA) 108 (90 Base) MCG/ACT inhaler TAKE 2 PUFFS BY MOUTH EVERY 6 HOURS AS NEEDED FOR WHEEZE OR SHORTNESS OF BREATH 02/06/24   Sebastian Sham, CNM  amoxicillin -clavulanate (AUGMENTIN ) 875-125 MG tablet Take 1 tablet by mouth every 12 (twelve) hours. 02/09/24  Yes Corlis Burnard DEL, NP  cetirizine  (ZYRTEC ) 10 MG tablet Take 1 tablet (10 mg total) by mouth daily. 06/17/23   Bernardo Fend, DO  estradiol  (CLIMARA ) 0.0375 mg/24hr patch Place 1 patch (0.0375 mg total) onto the skin once a week. 12/19/23   Sebastian Sham, CNM  montelukast  (SINGULAIR ) 10 MG tablet Take 1 tablet (10 mg total) by mouth at bedtime. 06/17/23   Bernardo Fend, DO  omeprazole   (PRILOSEC) 40 MG capsule Take 1 capsule (40 mg total) by mouth daily. 09/30/23   Bernardo Fend, DO  ondansetron  (ZOFRAN -ODT) 4 MG disintegrating tablet Take 1 tablet (4 mg total) by mouth every 8 (eight) hours as needed for nausea or vomiting. 12/04/23   Saunders Shona CROME, PA-C  oxyCODONE  (OXY IR/ROXICODONE ) 5 MG immediate release tablet Take 1 tablet (5 mg total) by mouth every 6 (six) hours as needed. Patient not taking: Reported on 02/09/2024 12/04/23   Saunders Shona CROME, PA-C  predniSONE  (DELTASONE ) 10 MG tablet Take 4 tablets (40 mg total) by mouth daily for 5 days. 02/09/24 02/14/24 Yes Corlis Burnard DEL, NP  Semaglutide -Weight Management (WEGOVY ) 1 MG/0.5ML SOAJ Inject 1 mg into the skin once a week. Patient not taking: Reported on 02/09/2024 11/10/23   Bernardo Fend, DO  buPROPion (WELLBUTRIN XL) 150 MG 24 hr tablet Take 150 mg by mouth daily.  09/11/20  [provider]  escitalopram  (LEXAPRO ) 20 MG tablet Take 1 tablet by mouth daily. 12/29/15 03/02/19  [provider]  levonorgestrel (MIRENA) 20 MCG/24HR IUD 1 each once by Intrauterine route.  03/02/19  [provider]    Family History Family History  Problem Relation Age of Onset   COPD Mother    Emphysema Mother    Cancer Father    Kidney  disease Son    Autism Son     Social History Social History   Tobacco Use   Smoking status: Every Day    Current packs/day: 0.30    Types: Cigarettes   Smokeless tobacco: Never  Vaping Use   Vaping status: Never Used  Substance Use Topics   Alcohol use: No   Drug use: Never     Allergies   Codeine, Hydrocodone-acetaminophen , and Vicodin [hydrocodone-acetaminophen ]   Review of Systems Review of Systems  Constitutional:  Negative for chills and fever.  HENT:  Positive for congestion, ear pain and postnasal drip. Negative for sore throat.   Respiratory:  Positive for cough, shortness of breath and wheezing.   Cardiovascular:  Negative for chest pain and  palpitations.  Gastrointestinal:  Negative for diarrhea and vomiting.     Physical Exam Triage Vital Signs ED Triage Vitals [02/09/24 1540]  Encounter Vitals Group     BP 130/83     Girls Systolic BP Percentile      Girls Diastolic BP Percentile      Boys Systolic BP Percentile      Boys Diastolic BP Percentile      Pulse Rate 81     Resp 19     Temp 97.7 F (36.5 C)     Temp src      SpO2 98 %     Weight      Height      Head Circumference      Peak Flow      Pain Score      Pain Loc      Pain Education      Exclude from Growth Chart    No data found.  Updated Vital Signs BP 130/83   Pulse 81   Temp 97.7 F (36.5 C)   Resp 19   LMP  (LMP Unknown)   SpO2 98%   Visual Acuity Right Eye Distance:   Left Eye Distance:   Bilateral Distance:    Right Eye Near:   Left Eye Near:    Bilateral Near:     Physical Exam Constitutional:      General: She is not in acute distress. HENT:     Right Ear: Tympanic membrane normal.     Left Ear: Tympanic membrane normal.     Nose: Congestion present.     Mouth/Throat:     Mouth: Mucous membranes are moist.     Pharynx: Oropharynx is clear.  Cardiovascular:     Rate and Rhythm: Normal rate and regular rhythm.     Heart sounds: Normal heart sounds.  Pulmonary:     Effort: Pulmonary effort is normal. No respiratory distress.     Breath sounds: Wheezing present.     Comments: Few faint expiratory wheezes. Neurological:     Mental Status: She is alert.      UC Treatments / Results  Labs (all labs ordered are listed, but only abnormal results are displayed) Labs Reviewed - No data to display  EKG   Radiology No results found.  Procedures Procedures (including critical care time)  Medications Ordered in UC Medications - No data to display  Initial Impression / Assessment and Plan / UC Course  I have reviewed the triage vital signs and the nursing notes.  Pertinent labs & imaging results that were  available during my care of the patient were reviewed by me and considered in my medical decision making (see chart for details).  Asthma exacerbation, acute sinusitis.  Afebrile and vital signs are stable.  Patient has a few expiratory wheezes but O2 sat is 98% on room air and no respiratory distress.  Treating today with Augmentin  for the sinus infection, prednisone  for asthma exacerbation, Tessalon  Perles for cough.  Instructed patient to continue using her albuterol  inhaler as directed.  ED precautions given.  Education provided on adult asthma and sinus infection.  Instructed her to follow-up with her PCP.  She agrees to plan of care.  Final Clinical Impressions(s) / UC Diagnoses   Final diagnoses:  Asthma with acute exacerbation, unspecified asthma severity, unspecified whether persistent  Acute non-recurrent maxillary sinusitis     Discharge Instructions      Take the Augmentin , prednisone , and Tessalon  Perles as directed.  Follow-up with your primary care provider.     ED Prescriptions     Medication Sig Dispense Auth. Provider   predniSONE  (DELTASONE ) 10 MG tablet Take 4 tablets (40 mg total) by mouth daily for 5 days. 20 tablet Aiyanah Kalama H, NP   amoxicillin -clavulanate (AUGMENTIN ) 875-125 MG tablet Take 1 tablet by mouth every 12 (twelve) hours. 14 tablet Corlis Burnard DEL, NP   benzonatate  (TESSALON ) 100 MG capsule Take 1 capsule (100 mg total) by mouth 3 (three) times daily as needed for cough. 21 capsule Corlis Burnard DEL, NP      PDMP not reviewed this encounter.   Corlis Burnard DEL, NP 02/09/24 1606

## 2024-02-09 NOTE — Discharge Instructions (Addendum)
 Take the Augmentin , prednisone , and Tessalon  Perles as directed.  Follow-up with your primary care provider.

## 2024-02-10 NOTE — Telephone Encounter (Signed)
 Requested Prescriptions  Pending Prescriptions Disp Refills   semaglutide -weight management (WEGOVY ) 1 MG/0.5ML SOAJ SQ injection [Pharmacy Med Name: WEGOVY  1 MG/0.5 ML PEN] 2 mL 0    Sig: INJECT 1MG  INTO THE SKIN ONCE A WEEK     Endocrinology:  Diabetes - GLP-1 Receptor Agonists - semaglutide  Passed - 02/10/2024 10:47 AM      Passed - HBA1C in normal range and within 180 days    Hgb A1c MFr Bld  Date Value Ref Range Status  08/15/2023 5.5 <5.7 % of total Hgb Final    Comment:    For the purpose of screening for the presence of diabetes: . <5.7%       Consistent with the absence of diabetes 5.7-6.4%    Consistent with increased risk for diabetes             (prediabetes) > or =6.5%  Consistent with diabetes . This assay result is consistent with a decreased risk of diabetes. . Currently, no consensus exists regarding use of hemoglobin A1c for diagnosis of diabetes in children. . According to American Diabetes Association (ADA) guidelines, hemoglobin A1c <7.0% represents optimal control in non-pregnant diabetic patients. Different metrics may apply to specific patient populations.  Standards of Medical Care in Diabetes(ADA). .          Passed - Cr in normal range and within 360 days    Creat  Date Value Ref Range Status  08/15/2023 1.01 0.50 - 1.03 mg/dL Final   Creatinine, Ser  Date Value Ref Range Status  12/04/2023 0.97 0.44 - 1.00 mg/dL Final   Creatinine, Urine  Date Value Ref Range Status  03/08/2018 272 mg/dL Final         Passed - Valid encounter within last 6 months    Recent Outpatient Visits           4 months ago Obesity (BMI 30-39.9)   [place] [facility] Bernardo Fend, DO   5 months ago Annual physical exam   [facility] Bernardo Fend, [place]

## 2024-03-30 ENCOUNTER — Other Ambulatory Visit: Payer: Self-pay | Admitting: Internal Medicine

## 2024-03-30 DIAGNOSIS — E669 Obesity, unspecified: Secondary | ICD-10-CM

## 2024-04-01 NOTE — Telephone Encounter (Signed)
 Requested medication (s) are due for refill today - no  Requested medication (s) are on the active medication list -yes  Future visit scheduled -no  Last refill: 03/30/24  Notes to clinic:   Pharmacy comment: Alternative Requested:PA IS REQUIRED PER INSURANCE.    Requested Prescriptions  Pending Prescriptions Disp Refills   WEGOVY  1 MG/0.5ML SOAJ SQ injection [Pharmacy Med Name: WEGOVY  1 MG/0.5 ML PEN]  0    Sig: INJECT 1 MG INTO THE SKIN ONE TIME PER WEEK     Endocrinology:  Diabetes - GLP-1 Receptor Agonists - semaglutide  Failed - 04/01/2024 10:59 AM      Failed - HBA1C in normal range and within 180 days    Hgb A1c MFr Bld  Date Value Ref Range Status  08/15/2023 5.5 <5.7 % of total Hgb Final    Comment:    For the purpose of screening for the presence of diabetes: . <5.7%       Consistent with the absence of diabetes 5.7-6.4%    Consistent with increased risk for diabetes             (prediabetes) > or =6.5%  Consistent with diabetes . This assay result is consistent with a decreased risk of diabetes. . Currently, no consensus exists regarding use of hemoglobin A1c for diagnosis of diabetes in children. . According to American Diabetes Association (ADA) guidelines, hemoglobin A1c <7.0% represents optimal control in non-pregnant diabetic patients. Different metrics may apply to specific patient populations.  Standards of Medical Care in Diabetes(ADA). .          Failed - Valid encounter within last 6 months    Recent Outpatient Visits           6 months ago Obesity (BMI 30-39.9)   [facility] Bernardo Fend, DO   7 months ago Annual physical exam   [facility] Bernardo Fend, [place]              Passed - Cr in normal range and within 360 days    Creat  Date Value Ref Range Status  08/15/2023 1.01 0.50 - 1.03 mg/dL Final   Creatinine, Ser  Date Value Ref Range Status  12/04/2023 0.97 0.44 -  1.00 mg/dL Final   Creatinine, Urine  Date Value Ref Range Status  03/08/2018 272 mg/dL Final            Requested Prescriptions  Pending Prescriptions Disp Refills   WEGOVY  1 MG/0.5ML SOAJ SQ injection [Pharmacy Med Name: WEGOVY  1 MG/0.5 ML PEN]  0    Sig: INJECT 1 MG INTO THE SKIN ONE TIME PER WEEK     Endocrinology:  Diabetes - GLP-1 Receptor Agonists - semaglutide  Failed - 04/01/2024 10:59 AM      Failed - HBA1C in normal range and within 180 days    Hgb A1c MFr Bld  Date Value Ref Range Status  08/15/2023 5.5 <5.7 % of total Hgb Final    Comment:    For the purpose of screening for the presence of diabetes: . <5.7%       Consistent with the absence of diabetes 5.7-6.4%    Consistent with increased risk for diabetes             (prediabetes) > or =6.5%  Consistent with diabetes . This assay result is consistent with a decreased risk of diabetes. . Currently, no consensus exists regarding use of hemoglobin A1c for diagnosis of diabetes in children. SABRA  According to American Diabetes Association (ADA) guidelines, hemoglobin A1c <7.0% represents optimal control in non-pregnant diabetic patients. Different metrics may apply to specific patient populations.  Standards of Medical Care in Diabetes(ADA). .          Failed - Valid encounter within last 6 months    Recent Outpatient Visits           6 months ago Obesity (BMI 30-39.9)   [facility] Bernardo Fend, DO   7 months ago Annual physical exam   [facility] Bernardo Fend, [place]              Passed - Cr in normal range and within 360 days    Creat  Date Value Ref Range Status  08/15/2023 1.01 0.50 - 1.03 mg/dL Final   Creatinine, Ser  Date Value Ref Range Status  12/04/2023 0.97 0.44 - 1.00 mg/dL Final   Creatinine, Urine  Date Value Ref Range Status  03/08/2018 272 mg/dL Final

## 2024-04-02 ENCOUNTER — Other Ambulatory Visit (HOSPITAL_COMMUNITY): Payer: Self-pay

## 2024-04-02 NOTE — Telephone Encounter (Signed)
 Pharmacy Patient [employer] companies are becoming increasingly stricter about requiring thorough documentation of lifestyle modifications in the patient's chart at each visit. This includes detailed records of diet recommendations (caloric intake, etc), exercise plans (amount of time/wk, etc), and an emphasis on the patient's commitment to continuing these efforts while on medication.  Without this additional documentation in the chart notes, a prior authorization will most likely be denied.   Good morning Alexis Gilbert, we also need a recent weight and BMI, insurance companies require within the last 45 days please.  Thanks!

## 2024-04-15 ENCOUNTER — Ambulatory Visit: Admitting: Internal Medicine

## 2024-04-27 ENCOUNTER — Ambulatory Visit
Admission: RE | Admit: 2024-04-27 | Discharge: 2024-04-27 | Disposition: A | Discharge status: Home / Self Care | Source: Ambulatory Visit | Attending: Emergency Medicine | Admitting: Emergency Medicine

## 2024-04-27 VITALS — BP 115/75 | HR 88 | Temp 98.2°F | Resp 18

## 2024-04-27 DIAGNOSIS — U071 COVID-19: Secondary | ICD-10-CM | POA: Diagnosis not present

## 2024-04-27 DIAGNOSIS — B349 Viral infection, unspecified: Secondary | ICD-10-CM | POA: Diagnosis not present

## 2024-04-27 LAB — POC COVID19/FLU A&B COMBO
Covid Antigen, POC: POSITIVE — AB
Influenza A Antigen, POC: NEGATIVE
Influenza B Antigen, POC: NEGATIVE

## 2024-04-27 MED ORDER — PROMETHAZINE-DM 6.25-15 MG/5ML PO SYRP: 5.0000 mL | ORAL_SOLUTION | Freq: Every evening | ORAL | 0 refills | Status: AC | PRN

## 2024-04-27 MED ORDER — BENZONATATE 100 MG PO CAPS: 100.0000 mg | ORAL_CAPSULE | Freq: Three times a day (TID) | ORAL | 0 refills | Status: AC

## 2024-04-27 MED ORDER — PREDNISONE 10 MG (21) PO TBPK: ORAL_TABLET | Freq: Every day | ORAL | 0 refills | Status: AC

## 2024-04-27 MED ORDER — PAXLOVID (300/100) 20 X 150 MG & 10 X 100MG PO TBPK: 3.0000 | ORAL_TABLET | Freq: Two times a day (BID) | ORAL | 0 refills | Status: AC

## 2024-04-27 NOTE — Discharge Instructions (Signed)
 Covid 19 is a virus and should steadily improve in time it can take up to 7 to 10 days before you truly start to see a turnaround however things will get better    Per the CDC you will need to quarantine and to your 24 hours without fever, if no fever may continue activity wearing mask  Given Paxlovid twice daily for 5 days to reduce the amount of virus in the body helping to calm symptoms and timeline that you are sick, does not fully take away your illness  Begin prednisone  every morning with food to open and relax the airway making it easier for you to breathe will also help with sinus pain and pressure  You may use Tessalon  pill every 8 hours as needed for cough and may use cough syrup at bedtime to allow for rest  You can take Tylenol   as needed for fever reduction and pain relief.   For cough: honey 1/2 to 1 teaspoon (you can dilute the honey in water or another fluid).  You can also use guaifenesin and dextromethorphan for cough. You can use a humidifier for chest congestion and cough.  If you don't have a humidifier, you can sit in the bathroom with the hot shower running.      For sore throat: try warm salt water gargles, cepacol lozenges, throat spray, warm tea or water with lemon/honey, popsicles or ice, or OTC cold relief medicine for throat discomfort.   For congestion: take a daily anti-histamine like Zyrtec , Claritin , and a oral decongestant, such as pseudoephedrine .  You can also use Flonase  1-2 sprays in each nostril daily.   It is important to stay hydrated: drink plenty of fluids (water, gatorade/powerade/pedialyte, juices, or teas) to keep your throat moisturized and help further relieve irritation/discomfort.

## 2024-04-27 NOTE — ED Provider Notes (Signed)
 [name]    CSN: [id number] Arrival date & time: 04/27/24  9083      History   Chief Complaint Chief Complaint  Patient presents with   Fever    Have fever , cough, congestion,  fatigue - Entered by patient   Cough   Nasal Congestion   Otalgia   Sore Throat    HPI DARRELL LEONHARDT is a 53 y.o. female.   Patient presents for evaluation of fever peaking at 100, nasal congestion, rhinorrhea, bilateral ear pain, sore throat, productive cough with clear mucus and shortness of breath and wheezing present with coughing for 5 days.  Associated sinus pressure to the bilateral cheeks radiating into the ear .has experienced chest discomfort due to persistent cough.  Known exposure to COVID-19 and had both positive and negative COVID testing at her job and at home.  History of asthma has attempted use of inhaler which was ineffective.  Additionally has attempted use of Motrin .  Daily tobacco use.      Past Medical History:  Diagnosis Date   Asthma     Patient Active Problem List   Diagnosis Date Noted   Hot flashes 10/29/2022   Dysphagia 03/07/2018    Past Surgical History:  Procedure Laterality Date   ABDOMINAL HYSTERECTOMY     hemmorhoidectomy     HERNIA REPAIR     TUBAL LIGATION      OB History   No obstetric history on file.      Home Medications    Prior to Admission medications   Medication Sig Start Date End Date Taking? Authorizing Provider  albuterol  (VENTOLIN  HFA) 108 (90 Base) MCG/ACT inhaler TAKE 2 PUFFS BY MOUTH EVERY 6 HOURS AS NEEDED FOR WHEEZE OR SHORTNESS OF BREATH 02/06/24   Sebastian Sham, CNM  amoxicillin -clavulanate (AUGMENTIN ) 875-125 MG tablet Take 1 tablet by mouth every 12 (twelve) hours. 02/09/24   Corlis Burnard DEL, NP  benzonatate  (TESSALON ) 100 MG capsule Take 1 capsule (100 mg total) by mouth 3 (three) times daily as needed for cough. 02/09/24   Corlis Burnard DEL, NP  cetirizine  (ZYRTEC ) 10 MG tablet Take 1 tablet (10 mg total) by  mouth daily. 06/17/23   Bernardo Fend, DO  estradiol  (CLIMARA ) 0.0375 mg/24hr patch Place 1 patch (0.0375 mg total) onto the skin once a week. 12/19/23   Sebastian Sham, CNM  montelukast  (SINGULAIR ) 10 MG tablet Take 1 tablet (10 mg total) by mouth at bedtime. 06/17/23   Bernardo Fend, DO  omeprazole  (PRILOSEC) 40 MG capsule Take 1 capsule (40 mg total) by mouth daily. 09/30/23   Bernardo Fend, DO  ondansetron  (ZOFRAN -ODT) 4 MG disintegrating tablet Take 1 tablet (4 mg total) by mouth every 8 (eight) hours as needed for nausea or vomiting. 12/04/23   Saunders Shona CROME, PA-C  oxyCODONE  (OXY IR/ROXICODONE ) 5 MG immediate release tablet Take 1 tablet (5 mg total) by mouth every 6 (six) hours as needed. Patient not taking: Reported on 02/09/2024 12/04/23   Saunders Shona CROME, PA-C  semaglutide -weight management (WEGOVY ) 1 MG/0.5ML SOAJ SQ injection INJECT 1MG  INTO THE SKIN ONCE A WEEK 02/10/24   Bernardo Fend, DO  buPROPion (WELLBUTRIN XL) 150 MG 24 hr tablet Take 150 mg by mouth daily.  09/11/20  [provider]  escitalopram  (LEXAPRO ) 20 MG tablet Take 1 tablet by mouth daily. 12/29/15 03/02/19  [provider]  levonorgestrel (MIRENA) 20 MCG/24HR IUD 1 each once by Intrauterine route.  03/02/19  [provider]  Family History Family History  Problem Relation Age of Onset   COPD Mother    Emphysema Mother    Cancer Father    Kidney disease Son    Autism Son     Social History Social History   Tobacco Use   Smoking status: Every Day    Current packs/day: 0.30    Types: Cigarettes   Smokeless tobacco: Never  Vaping Use   Vaping status: Never Used  Substance Use Topics   Alcohol use: No   Drug use: Never     Allergies   Codeine, Hydrocodone-acetaminophen , and Vicodin [hydrocodone-acetaminophen ]   Review of Systems Review of Systems  Constitutional:  Positive for fever. Negative for activity change, appetite change, chills, diaphoresis,  fatigue and unexpected weight change.  HENT:  Positive for congestion, ear pain, sinus pressure and sore throat. Negative for dental problem, drooling, ear discharge, facial swelling, hearing loss, mouth sores, nosebleeds, postnasal drip, rhinorrhea, sinus pain, sneezing, tinnitus, trouble swallowing and voice change.   Respiratory:  Positive for cough, shortness of breath and wheezing. Negative for apnea, choking, chest tightness and stridor.   Gastrointestinal: Negative.      Physical Exam Triage Vital Signs ED Triage Vitals  Encounter Vitals Group     BP 04/27/24 1009 115/75     Girls Systolic BP Percentile --      Girls Diastolic BP Percentile --      Boys Systolic BP Percentile --      Boys Diastolic BP Percentile --      Pulse Rate 04/27/24 1009 88     Resp 04/27/24 1009 18     Temp 04/27/24 1009 98.2 F (36.8 C)     Temp Source 04/27/24 1009 Oral     SpO2 04/27/24 1009 98 %     Weight --      Height --      Head Circumference --      Peak Flow --      Pain Score 04/27/24 1006 6     Pain Loc --      Pain Education --      Exclude from Growth Chart --    No data found.  Updated Vital Signs BP 115/75 (BP Location: Left Arm)   Pulse 88   Temp 98.2 F (36.8 C) (Oral)   Resp 18   LMP  (LMP Unknown)   SpO2 98%   Visual Acuity Right Eye Distance:   Left Eye Distance:   Bilateral Distance:    Right Eye Near:   Left Eye Near:    Bilateral Near:     Physical Exam Constitutional:      Appearance: Normal appearance.  HENT:     Right Ear: Tympanic membrane, ear canal and external ear normal.     Left Ear: Tympanic membrane, ear canal and external ear normal.     Nose: Congestion present.     Mouth/Throat:     Pharynx: No posterior oropharyngeal erythema.  Eyes:     Extraocular Movements: Extraocular movements intact.  Cardiovascular:     Rate and Rhythm: Normal rate and regular rhythm.     Pulses: Normal pulses.     Heart sounds: Normal heart sounds.   Pulmonary:     Effort: Pulmonary effort is normal.     Breath sounds: Normal breath sounds.  Lymphadenopathy:     Cervical: Cervical adenopathy present.  Neurological:     Mental Status: She is alert and oriented to person, place, and time.  UC Treatments / Results  Labs (all labs ordered are listed, but only abnormal results are displayed) Labs Reviewed  POC COVID19/FLU A&B COMBO    EKG   Radiology No results found.  Procedures Procedures (including critical care time)  Medications Ordered in UC Medications - No data to display  Initial Impression / Assessment and Plan / UC Course  I have reviewed the triage vital signs and the nursing notes.  Pertinent labs & imaging results that were available during my care of the patient were reviewed by me and considered in my medical decision making (see chart for details).  COVID-19, viral illness  Patient is in no signs of distress nor toxic appearing.  Vital signs are stable.  Low suspicion for pneumonia, pneumothorax or bronchitis and therefore will defer imaging.  COVID testing positive, discussed with patient, prescribed Paxlovid, on day 5 of illness discussed quarantine per the CDC.  Additionally prescribed prednisone  Tessalon  and Promethazine  DM.May use additional over-the-counter medications as needed for supportive care.  May follow-up with urgent care as needed if symptoms persist or worsen.  Note given.   Final Clinical Impressions(s) / UC Diagnoses   Final diagnoses:  Viral illness   Discharge Instructions   None    ED Prescriptions   None    PDMP not reviewed this encounter.   Teresa Shelba SAUNDERS, NP 04/27/24 1042

## 2024-04-27 NOTE — ED Triage Notes (Signed)
 Patient reports Fever, nasal congestion, cough and bilateral ear pain and sore throat from coughing x 5 days. Patient has taking Motrin  with mild relief.  Rates ear pain 6/10.

## 2024-06-11 ENCOUNTER — Telehealth: Admitting: Physician Assistant

## 2024-06-11 DIAGNOSIS — J4531 Mild persistent asthma with (acute) exacerbation: Secondary | ICD-10-CM

## 2024-06-11 DIAGNOSIS — J019 Acute sinusitis, unspecified: Secondary | ICD-10-CM

## 2024-06-11 DIAGNOSIS — B9689 Other specified bacterial agents as the cause of diseases classified elsewhere: Secondary | ICD-10-CM

## 2024-06-11 MED ORDER — AMOXICILLIN-POT CLAVULANATE 875-125 MG PO TABS: 1.0000 | ORAL_TABLET | Freq: Two times a day (BID) | ORAL | 0 refills | Status: AC

## 2024-06-11 MED ORDER — ALBUTEROL SULFATE HFA 108 (90 BASE) MCG/ACT IN AERS: 1.0000 | INHALATION_SPRAY | Freq: Four times a day (QID) | RESPIRATORY_TRACT | 0 refills | Status: AC | PRN

## 2024-06-11 NOTE — Progress Notes (Signed)
 " Virtual Visit Consent   Alexis Gilbert, you are scheduled for a virtual visit with a [facility] provider today. Just as with appointments in the office, your consent must be obtained to participate. Your consent will be active for this visit and any virtual visit you may have with one of our providers in the next 365 days. If you have a MyChart account, a copy of this consent can be sent to you electronically.  As this is a virtual visit, video technology does not allow for your provider to perform a traditional examination. This may limit your provider's ability to fully assess your condition. If your provider identifies any concerns that need to be evaluated in person or the need to arrange testing (such as labs, EKG, etc.), we will make arrangements to do so. Although advances in technology are sophisticated, we cannot ensure that it will always work on either your end or our end. If the connection with a video visit is poor, the visit may have to be switched to a telephone visit. With either a video or telephone visit, we are not always able to ensure that we have a secure connection.  By engaging in this virtual visit, you consent to the provision of healthcare and authorize for your insurance to be billed (if applicable) for the services provided during this visit. Depending on your insurance coverage, you may receive a charge related to this service.  I need to obtain your verbal consent now. Are you willing to proceed with your visit today? Alexis Gilbert has provided verbal consent on 06/11/2024 for a virtual visit (video or telephone). Alexis CHRISTELLA Dickinson, PA-C  Date: 06/11/2024 8:12 AM   Virtual Visit via Video Note   I, Alexis Gilbert, connected with  Alexis Gilbert  ([id number], 52-16-1972) on 06/11/24 at  8:00 AM EST by a video-enabled telemedicine application and verified that I am speaking with the correct person using two identifiers.  Location: Patient: Virtual Visit  Location Patient: Home Provider: Virtual Visit Location Provider: Home Office   I discussed the limitations of evaluation and management by telemedicine and the availability of in person appointments. The patient expressed understanding and agreed to proceed.    History of Present Illness: Alexis Gilbert is a 54 y.o. who identifies as a female who was assigned female at birth, and is being seen today for sinus congestion and cough.  HPI: URI  This is a new problem. The current episode started 1 to 4 weeks ago (Had Covid 04/30/25, congestion has been progressing since). The problem has been gradually worsening. There has been no fever. Associated symptoms include congestion (chest and sinus), coughing (productive), headaches, rhinorrhea, sinus pain and wheezing. Pertinent negatives include no diarrhea, ear pain, nausea, plugged ear sensation, sore throat or vomiting. Treatments tried: Paxlovid , Prednisone , Promethazine  DM, Mucinex, Motrin , Tylenol . The treatment provided no relief.  PMH: Asthma   Problems:  Patient Active Problem List   Diagnosis Date Noted   Hot flashes 10/29/2022   Dysphagia 03/07/2018    Allergies: Allergies[1] Medications: Current Medications[2]  Observations/Objective: Patient is well-developed, well-nourished in no acute distress.  Resting comfortably at home.  Head is normocephalic, atraumatic.  No labored breathing.  Speech is clear and coherent with logical content.  Patient is alert and oriented at baseline.    Assessment and Plan: 1. Acute bacterial sinusitis (Primary) - amoxicillin -clavulanate (AUGMENTIN ) 875-125 MG tablet; Take 1 tablet by mouth 2 (two) times daily.  Dispense: 14 tablet; Refill:  0  2. Mild persistent asthma with acute exacerbation - albuterol  (VENTOLIN  HFA) 108 (90 Base) MCG/ACT inhaler; Inhale 1-2 puffs into the lungs every 6 (six) hours as needed.  Dispense: 8 g; Refill: 0  - Worsening symptoms that have not responded to OTC  medications.  - Will give Augmentin  - Continue previously prescribed Promethazine  DM as needed - Can continue PLAIN Mucinex during the daytime as needed - Continue allergy medications.  - Steam and humidifier can help - Stay well hydrated and get plenty of rest.  - Albuterol  refilled - Seek in person evaluation if no symptom improvement or if symptoms worsen   Follow Up Instructions: I discussed the assessment and treatment plan with the patient. The patient was provided an opportunity to ask questions and all were answered. The patient agreed with the plan and demonstrated an understanding of the instructions.  A copy of instructions were sent to the patient via MyChart unless otherwise noted below.    The patient was advised to call back or seek an in-person evaluation if the symptoms worsen or if the condition fails to improve as anticipated.    Zacari Stiff M Samyukta Cura, PA-C     [1]  Allergies Allergen Reactions   Codeine Anaphylaxis   Hydrocodone-Acetaminophen  Swelling   Vicodin [Hydrocodone-Acetaminophen ] Rash  [2]  Current Outpatient Medications:    albuterol  (VENTOLIN  HFA) 108 (90 Base) MCG/ACT inhaler, Inhale 1-2 puffs into the lungs every 6 (six) hours as needed., Disp: 8 g, Rfl: 0   amoxicillin -clavulanate (AUGMENTIN ) 875-125 MG tablet, Take 1 tablet by mouth 2 (two) times daily., Disp: 14 tablet, Rfl: 0   benzonatate  (TESSALON ) 100 MG capsule, Take 1 capsule (100 mg total) by mouth every 8 (eight) hours., Disp: 21 capsule, Rfl: 0   cetirizine  (ZYRTEC ) 10 MG tablet, Take 1 tablet (10 mg total) by mouth daily., Disp: 30 tablet, Rfl: 11   estradiol  (CLIMARA ) 0.0375 mg/24hr patch, Place 1 patch (0.0375 mg total) onto the skin once a week., Disp: 4 patch, Rfl: 12   montelukast  (SINGULAIR ) 10 MG tablet, Take 1 tablet (10 mg total) by mouth at bedtime., Disp: 30 tablet, Rfl: 3   omeprazole  (PRILOSEC) 40 MG capsule, Take 1 capsule (40 mg total) by mouth daily., Disp: 30 capsule,  Rfl: 3   ondansetron  (ZOFRAN -ODT) 4 MG disintegrating tablet, Take 1 tablet (4 mg total) by mouth every 8 (eight) hours as needed for nausea or vomiting., Disp: 12 tablet, Rfl: 0   oxyCODONE  (OXY IR/ROXICODONE ) 5 MG immediate release tablet, Take 1 tablet (5 mg total) by mouth every 6 (six) hours as needed. (Patient not taking: Reported on 02/09/2024), Disp: 12 tablet, Rfl: 0   predniSONE  (STERAPRED UNI-PAK 21 TAB) 10 MG (21) TBPK tablet, Take by mouth daily. Take 6 tabs by mouth daily  for 1 days, then 5 tabs for 1 days, then 4 tabs for 1 days, then 3 tabs for 1 days, 2 tabs for 1 days, then 1 tab by mouth daily for 1 days, Disp: 21 tablet, Rfl: 0   promethazine -dextromethorphan (PROMETHAZINE -DM) 6.25-15 MG/5ML syrup, Take 5 mLs by mouth at bedtime as needed., Disp: 118 mL, Rfl: 0   semaglutide -weight management (WEGOVY ) 1 MG/0.5ML SOAJ SQ injection, INJECT 1MG  INTO THE SKIN ONCE A WEEK, Disp: 2 mL, Rfl: 0  "

## 2024-06-11 NOTE — Patient Instructions (Signed)
 " Alexis Gilbert, thank you for joining Alexis CHRISTELLA Dickinson, PA-C for today's virtual visit.  While this provider is not your primary care provider (PCP), if your PCP is located in our provider database this encounter information will be shared with them immediately following your visit.   A [facility] MyChart account gives you access to today's visit and all your visits, tests, and labs performed at [facility]  click here if you don't have a [facility] MyChart account or go to [email or web address]  Consent: (Patient) Alexis Gilbert provided verbal consent for this virtual visit at the beginning of the encounter.  Current Medications:  Current Outpatient Medications:    albuterol  (VENTOLIN  HFA) 108 (90 Base) MCG/ACT inhaler, Inhale 1-2 puffs into the lungs every 6 (six) hours as needed., Disp: 8 g, Rfl: 0   amoxicillin -clavulanate (AUGMENTIN ) 875-125 MG tablet, Take 1 tablet by mouth 2 (two) times daily., Disp: 14 tablet, Rfl: 0   benzonatate  (TESSALON ) 100 MG capsule, Take 1 capsule (100 mg total) by mouth every 8 (eight) hours., Disp: 21 capsule, Rfl: 0   cetirizine  (ZYRTEC ) 10 MG tablet, Take 1 tablet (10 mg total) by mouth daily., Disp: 30 tablet, Rfl: 11   estradiol  (CLIMARA ) 0.0375 mg/24hr patch, Place 1 patch (0.0375 mg total) onto the skin once a week., Disp: 4 patch, Rfl: 12   montelukast  (SINGULAIR ) 10 MG tablet, Take 1 tablet (10 mg total) by mouth at bedtime., Disp: 30 tablet, Rfl: 3   omeprazole  (PRILOSEC) 40 MG capsule, Take 1 capsule (40 mg total) by mouth daily., Disp: 30 capsule, Rfl: 3   ondansetron  (ZOFRAN -ODT) 4 MG disintegrating tablet, Take 1 tablet (4 mg total) by mouth every 8 (eight) hours as needed for nausea or vomiting., Disp: 12 tablet, Rfl: 0   oxyCODONE  (OXY IR/ROXICODONE ) 5 MG immediate release tablet, Take 1 tablet (5 mg total) by mouth every 6 (six) hours as needed. (Patient not taking: Reported on 02/09/2024), Disp: 12 tablet, Rfl: 0    predniSONE  (STERAPRED UNI-PAK 21 TAB) 10 MG (21) TBPK tablet, Take by mouth daily. Take 6 tabs by mouth daily  for 1 days, then 5 tabs for 1 days, then 4 tabs for 1 days, then 3 tabs for 1 days, 2 tabs for 1 days, then 1 tab by mouth daily for 1 days, Disp: 21 tablet, Rfl: 0   promethazine -dextromethorphan (PROMETHAZINE -DM) 6.25-15 MG/5ML syrup, Take 5 mLs by mouth at bedtime as needed., Disp: 118 mL, Rfl: 0   semaglutide -weight management (WEGOVY ) 1 MG/0.5ML SOAJ SQ injection, INJECT 1MG  INTO THE SKIN ONCE A WEEK, Disp: 2 mL, Rfl: 0   Medications ordered in this encounter:  Meds ordered this encounter  Medications   albuterol  (VENTOLIN  HFA) 108 (90 Base) MCG/ACT inhaler    Sig: Inhale 1-2 puffs into the lungs every 6 (six) hours as needed.    Dispense:  8 g    Refill:  0    Supervising Provider:   BLAISE ALEENE KIDD [[id number]]   amoxicillin -clavulanate (AUGMENTIN ) 875-125 MG tablet    Sig: Take 1 tablet by mouth 2 (two) times daily.    Dispense:  14 tablet    Refill:  0    Supervising Provider:   BLAISE ALEENE KIDD [[id number]]     *If you need refills on other medications prior to your next appointment, please contact your pharmacy*  Follow-Up: Call back or seek an in-person evaluation if the symptoms worsen or if the condition fails to improve as anticipated.  [facility] [phone]  Other Instructions Sinus Infection, Adult A sinus infection, also called sinusitis, is inflammation of your sinuses. Sinuses are hollow spaces in the bones around your face. Your sinuses are located: Around your eyes. In the middle of your forehead. Behind your nose. In your cheekbones. Mucus normally drains out of your sinuses. When your nasal tissues become inflamed or swollen, mucus can become trapped or blocked. This allows bacteria, viruses, and fungi to grow, which leads to infection. Most infections of the sinuses are caused by a virus. A sinus infection can develop quickly. It can  last for up to 4 weeks (acute) or for more than 12 weeks (chronic). A sinus infection often develops after a cold. What are the causes? This condition is caused by anything that creates swelling in the sinuses or stops mucus from draining. This includes: Allergies. Asthma. Infection from bacteria or viruses. Deformities or blockages in your nose or sinuses. Abnormal growths in the nose (nasal polyps). Pollutants, such as chemicals or irritants in the air. Infection from fungi. This is rare. What increases the risk? You are more likely to develop this condition if you: Have a weak body defense system (immune system). Do a lot of swimming or diving. Overuse nasal sprays. Smoke. What are the signs or symptoms? The main symptoms of this condition are pain and a feeling of pressure around the affected sinuses. Other symptoms include: Stuffy nose or congestion that makes it difficult to breathe through your nose. Thick yellow or greenish drainage from your nose. Tenderness, swelling, and warmth over the affected sinuses. A cough that may get worse at night. Decreased sense of smell and taste. Extra mucus that collects in the throat or the back of the nose (postnasal drip) causing a sore throat or bad breath. Tiredness (fatigue). Fever. How is this diagnosed? This condition is diagnosed based on: Your symptoms. Your medical history. A physical exam. Tests to find out if your condition is acute or chronic. This may include: Checking your nose for nasal polyps. Viewing your sinuses using a device that has a light (endoscope). Testing for allergies or bacteria. Imaging tests, such as an MRI or CT scan. In rare cases, a bone biopsy may be done to rule out more serious types of fungal sinus disease. How is this treated? Treatment for a sinus infection depends on the cause and whether your condition is chronic or acute. If caused by a virus, your symptoms should go away on their own within  10 days. You may be given medicines to relieve symptoms. They include: Medicines that shrink swollen nasal passages (decongestants). A spray that eases inflammation of the nostrils (topical intranasal corticosteroids). Rinses that help get rid of thick mucus in your nose (nasal saline washes). Medicines that treat allergies (antihistamines). Over-the-counter pain relievers. If caused by bacteria, your health care provider may recommend waiting to see if your symptoms improve. Most bacterial infections will get better without antibiotic medicine. You may be given antibiotics if you have: A severe infection. A weak immune system. If caused by narrow nasal passages or nasal polyps, surgery may be needed. Follow these instructions at home: Medicines Take, use, or apply over-the-counter and prescription medicines only as told by your health care provider. These may include nasal sprays. If you were prescribed an antibiotic medicine, take it as told by your health care provider. Do not stop taking the antibiotic even if you start to feel better. Hydrate and humidify  Drink enough fluid  to keep your urine pale yellow. Staying hydrated will help to thin your mucus. Use a cool mist humidifier to keep the humidity level in your home above 50%. Inhale steam for 10-15 minutes, 3-4 times a day, or as told by your health care provider. You can do this in the bathroom while a hot shower is running. Limit your exposure to cool or dry air. Rest Rest as much as possible. Sleep with your head raised (elevated). Make sure you get enough sleep each night. General instructions  Apply a warm, moist washcloth to your face 3-4 times a day or as told by your health care provider. This will help with discomfort. Use nasal saline washes as often as told by your health care provider. Wash your hands often with soap and water to reduce your exposure to germs. If soap and water are not available, use hand  sanitizer. Do not smoke. Avoid being around people who are smoking (secondhand smoke). Keep all follow-up visits. This is important. Contact a health care provider if: You have a fever. Your symptoms get worse. Your symptoms do not improve within 10 days. Get help right away if: You have a severe headache. You have persistent vomiting. You have severe pain or swelling around your face or eyes. You have vision problems. You develop confusion. Your neck is stiff. You have trouble breathing. These symptoms may be an emergency. Get help right away. Call 911. Do not wait to see if the symptoms will go away. Do not drive yourself to the hospital. Summary A sinus infection is soreness and inflammation of your sinuses. Sinuses are hollow spaces in the bones around your face. This condition is caused by nasal tissues that become inflamed or swollen. The swelling traps or blocks the flow of mucus. This allows bacteria, viruses, and fungi to grow, which leads to infection. If you were prescribed an antibiotic medicine, take it as told by your health care provider. Do not stop taking the antibiotic even if you start to feel better. Keep all follow-up visits. This is important. This information is not intended to replace advice given to you by your health care provider. Make sure you discuss any questions you have with your health care provider. Document Revised: 04/17/2021 Document Reviewed: 04/17/2021 Elsevier Patient Education  2024 [employer]   If you have been instructed to have an in-person evaluation today at a local Urgent Care facility, please use the link below. It will take you to a list of all of our available [facility], including address, phone number and hours of operation. Please do not delay care.  [facility]  If you or a family member do not have a primary care provider, use the link below to schedule a visit and establish care. When you choose a  [facility] primary care physician or advanced practice provider, you gain a long-term partner in health. Find a Primary Care Provider  Learn more about [facility] in-office and virtual care options: [facility] - Get Care Now "

## 2024-08-16 ENCOUNTER — Encounter: Admitting: Internal Medicine
